# Patient Record
Sex: Female | Born: 1935 | Race: White | Hispanic: No | Smoking: Former smoker
Health system: Southern US, Community
[De-identification: ages and names within clinical notes are randomized; demographics above are authoritative.]

## PROBLEM LIST (undated history)

## (undated) ENCOUNTER — Emergency Department: Admission: EM | Payer: Self-pay | Source: Home / Self Care

## (undated) DIAGNOSIS — Z8719 Personal history of other diseases of the digestive system: Secondary | ICD-10-CM

## (undated) DIAGNOSIS — J45909 Unspecified asthma, uncomplicated: Secondary | ICD-10-CM

## (undated) DIAGNOSIS — G629 Polyneuropathy, unspecified: Secondary | ICD-10-CM

## (undated) DIAGNOSIS — T4145XA Adverse effect of unspecified anesthetic, initial encounter: Secondary | ICD-10-CM

## (undated) DIAGNOSIS — T8859XA Other complications of anesthesia, initial encounter: Secondary | ICD-10-CM

## (undated) DIAGNOSIS — F419 Anxiety disorder, unspecified: Secondary | ICD-10-CM

## (undated) DIAGNOSIS — K219 Gastro-esophageal reflux disease without esophagitis: Secondary | ICD-10-CM

## (undated) DIAGNOSIS — M1990 Unspecified osteoarthritis, unspecified site: Secondary | ICD-10-CM

## (undated) HISTORY — PX: CATARACT EXTRACTION, BILATERAL: SHX1313

## (undated) HISTORY — PX: JOINT REPLACEMENT: SHX530

## (undated) HISTORY — PX: COLONOSCOPY: SHX174

## (undated) HISTORY — PX: BACK SURGERY: SHX140

## (undated) HISTORY — PX: TUBAL LIGATION: SHX77

## (undated) HISTORY — PX: HEMORRHOID SURGERY: SHX153

## (undated) HISTORY — PX: VARICOSE VEIN SURGERY: SHX832

## (undated) HISTORY — PX: DILATION AND CURETTAGE OF UTERUS: SHX78

## (undated) HISTORY — PX: FOOT SURGERY: SHX648

## (undated) HISTORY — PX: BREAST SURGERY: SHX581

## (undated) HISTORY — PX: APPENDECTOMY: SHX54

## (undated) HISTORY — PX: ABDOMINAL HYSTERECTOMY: SHX81

---

## 2009-07-25 ENCOUNTER — Encounter: Admission: RE | Admit: 2009-07-25 | Discharge: 2009-07-25 | Payer: Self-pay | Admitting: Neurosurgery

## 2010-09-28 LAB — BASIC METABOLIC PANEL
BUN: 25 mg/dL — ABNORMAL HIGH (ref 6–23)
Calcium: 9.8 mg/dL (ref 8.4–10.5)
Creatinine, Ser: 1.03 mg/dL (ref 0.4–1.2)
GFR calc non Af Amer: 53 mL/min — ABNORMAL LOW (ref >60)
Glucose, Bld: 101 mg/dL — ABNORMAL HIGH (ref 70–99)
Potassium: 4.2 mEq/L (ref 3.5–5.1)

## 2010-09-28 LAB — CBC
HCT: 38.7 % (ref 36.0–46.0)
MCH: 28 pg (ref 26.0–34.0)
MCHC: 31.8 g/dL (ref 30.0–36.0)
MCV: 88.2 fL (ref 78.0–100.0)
Platelets: 266 10*3/uL (ref 150–400)
RDW: 13.6 % (ref 11.5–15.5)

## 2010-10-02 ENCOUNTER — Inpatient Hospital Stay (HOSPITAL_COMMUNITY)
Admission: RE | Admit: 2010-10-02 | Discharge: 2010-10-09 | DRG: 460 | Disposition: A | Discharge status: Skilled Nursing Facility | Payer: Medicare Other | Attending: Neurosurgery | Admitting: Neurosurgery

## 2010-10-02 DIAGNOSIS — Z880 Allergy status to penicillin: Secondary | ICD-10-CM

## 2010-10-02 DIAGNOSIS — M48061 Spinal stenosis, lumbar region without neurogenic claudication: Secondary | ICD-10-CM | POA: Diagnosis present

## 2010-10-02 DIAGNOSIS — M4310 Spondylolisthesis, site unspecified: Principal | ICD-10-CM | POA: Diagnosis present

## 2010-10-02 LAB — TYPE AND SCREEN: Antibody Screen: NEGATIVE

## 2010-10-08 ENCOUNTER — Encounter: Payer: Self-pay | Admitting: Internal Medicine

## 2010-10-25 NOTE — Op Note (Signed)
Jo Malone, Jo Malone              ACCOUNT NO.:  [id number]  MEDICAL RECORD NO.:  [id number]          PATIENT TYPE:  INP  LOCATION:  3020                         FACILITY:  [facility]  PHYSICIAN:  Reinaldo Meeker, M.D. DATE OF BIRTH:  1935/09/14  DATE OF PROCEDURE:  10/02/2010 DATE OF DISCHARGE:                              OPERATIVE REPORT   PREOPERATIVE DIAGNOSIS:  Spondylolisthesis with stenosis, L3-4, L4-5.  POSTOPERATIVE DIAGNOSIS:  Spondylolisthesis with stenosis, L3-4, L4-5.  PROCEDURES: 1. Left L3-4, L4-5 maximum access TLIF with PEEK interbody spacer. 2. Decompression of the L3, L4, and L5 nerve roots, more so than     needed for transverse lumbar interbody fusion followed by segmental     instrumentation L3-4, L4-5 with NuVasive DVR pedicle screw system.  SURGEON:  Reinaldo Meeker, MD  ASSISTANT:  Tia Alert, MD  PROCEDURE IN DETAIL:  After placing in the prone position, the patient's back was prepped and draped in usual sterile fashion.  Using AP fluoroscopy, entry point was identified for access to the L4 pedicle on the left.  Small stab incision was made.  Jamshidi needle was passed from lateral to medial direction and followed in excellent position under AP and lateral fluoroscopy.  Also used intraoperative EMG monitoring.  Placed guidewire through the Jamshidi needle and removed the needle.  We then did the same thing at L5.  Once again passing Jamshidi needle, placing the wire through it, and then removing the needle.  We then connected the two incisions, carried down through the fascia.  We then did sequential dilation at L4 and L5 and then tapped each level with 5-mm tap and placed 6.5 x 40 mm screws at both levels. These had the retractor blades attached, was then secured to the retractor system, attached the arm of the table.  We then opened the retractors placing the medial blade and secured the system to the table via the arm.  We then spent a 5-10  minutes removing the soft tissue to identify the facet joints and lamina of L4 and L5.  We did a generous laminotomy by removing the inferior half of the L4 lamina, the medial 80% of the facet joint, and the superior one-third of the L5 lamina. Residual bone was removed and saved to use later in the case. Ligamentum flavum removed in a piecemeal fashion.  We then took time undermining of the midline to do a sublaminar decompression on the opposite side due to the patient's diffuse stenosis.  We then identified the disk at L4-5 in the left, incised it, and thoroughly cleaned it out with pituitary rongeurs and curettes.  At this time, we prepared the disk space for interbody fusion.  We distracted up to an 8 mm size and felt that was a good fit.  We then used a rotating cutter and another instruments to clean the disk out of the disk space.  We then took a PEEK interbody spacer, filled it with a mixture of EquivaBone, Osteocel Plus, and autologous bone and packed it without difficulty.  Fluoroscopy showed it to be in excellent position.  Prior to placing the cage,  we placed the same mixture deep within the interspace to help with the interbody fusion.  At this time, we irrigated copiously and controlled any bleeding with bipolar coagulation and Gelfoam.  We then removed the hoop shim from the L5 screw and then attached the polyaxial head.  We then removed the retractor from both screws and rotated the middle screw to the opposite side so that was not the right-sided screw.  We then identified the lateral aspect of the L3 pedicle and a small stab incision, passed the Jamshidi needle through it in standard fashion.  I then placed the guidewire over the Jamshidi needle.  We then connected that incision to the lower incision and carried it through the fascia. We then did sequential dilation at L3 once again placing a 6.5 x 40-mm screw at that level.  We then attached the retractor once again  between the screws at L3 and L4, placed the medial blade.  We secured this to the table via the arm and then opened the blades in all directions.  We then once again took 10 minutes to remove soft tissue along the lamina and facet joint.  The facet was found to be markedly widened and pathologic.  We then once again did a generous laminotomy at this level once again removing the inferior three-quarters of the L3 lamina, the medial three-quarters of the facet joint, and the superior edge of the L4.  Once again, residual bone, ligamentum flavum removed in a piecemeal fashion.  Once again, we did generous sublaminar decompression in the opposite side because of the diffuse stenosis at this level.  At this time, the disk space of L3-4 was entered, cleaned out thoroughly with pituitary rongeurs and curettes.  We then distracted this level up to a 10-mm and felt this was a good fit, then used biting instruments to finish the preparation.  Prior to placing the PEEK interbody spacer, we placed a mixture of EquivaBone, OsteoSet Plus, and autologous bone deep within the interspace.  We then packed the PEEK spacer without difficulty and got a nice transverse kick until it was in excellent position.  We then irrigated once more at this level.  We then attached the polyaxial heads to the screws at L3 and L4.  We measured and chose a 60-mm rod.  We then secured to the top of the screws after advancing the screws in their final position, and then from the top loading knots, I did torque and counter torque, final tightening.  Final fluoroscopy in AP and lateral direction showed good placement of the interbody device as well as the screws and rods.  Irrigation was carried out once more. Any bleeding controlled with bipolar coagulation and Gelfoam.  I left a Hemovac drain, brought out through a separate stab incision.  We then closed the incision in multiple layers of Vicryl in the muscle  fascia, subcutaneous and subcuticular tissues and Dermabond was placed on the skin.  Sterile dressing was then applied.  The patient was extubated and taken to the recovery room in stable condition.          ______________________________ Reinaldo Meeker, M.D.     ROK/MEDQ  D:  10/02/2010  T:  10/03/2010  Job:  [id number]  Electronically Signed by Aliene Beams M.D. on 10/25/2010 10:49:11 PM

## 2010-10-25 NOTE — Discharge Summary (Signed)
  NAMECERYS, WINGET              ACCOUNT NO.:  [id number]  MEDICAL RECORD NO.:  [id number]          PATIENT TYPE:  INP  LOCATION:  3020                         FACILITY:  [facility]  PHYSICIAN:  Reinaldo Meeker, M.D. DATE OF BIRTH:  Feb 03, 1936  DATE OF ADMISSION:  10/02/2010 DATE OF DISCHARGE:  10/09/2010                              DISCHARGE SUMMARY   PRIMARY DIAGNOSIS:  Spondylolisthesis with stenosis L3-L4, L4-L5.  PRIMARY OPERATIVE PROCEDURE:  Left L3-L4, L4-L5 maximum access for PEEK interbody spacer.  HISTORY:  Ms. Haynie is a 75 year old female with back and left lower extremity pain.  She was evaluated with the imaging studies that showed a spondylolisthesis with stenosis L3-L4 and L4-L5 with left-sided nerve root compression.  After failing conservative therapy, she requested surgery.  She was admitted this time for left minimally invasive treatment at L3-L4, L4-L5.  On January 27th, the patient was taken to operating room with the above-mentioned procedure.  She tolerated the procedure well without any neurologic issues.  Her main issue postop here with a severe ileus, constipation which took a number of days to resolve.  We used Reglan as needed.  She began here with some bowel sounds, we gave her some laxatives and this did help finally for her bowel movement.  We then able to advance her diet without difficulty, which she tolerated well.  She is able to increase activities though she still did have some persistent left lower extremity pain, which is hopefully resolved with time.  On October 09, 2010, she was tolerating her diet, ambulating without difficulty and was felt that she be discharged to a skilled nursing facility for continued convalescence.  DISCHARGE MEDICATIONS: 1. Lasix 40 mg daily. 2. Advair inhaler 1 puff b.i.d. 3. Potassium chloride 10 mEq daily. 4. Reglan 10 mg three times a day. 5. Tessalon 100 mg capsule every 4 hours as needed for cough. 6.  Ventolin inhaler as needed. 7. Vicodin for pain. 8. Flexeril 10 mg three times a day as needed for pain. 9. Sterapred pack 6 day.  Her condition was markedly improved versus admission and her wound looked good.          ______________________________ Reinaldo Meeker, M.D.    ROK/MEDQ  D:  10/09/2010  T:  10/09/2010  Job:  [id number]  Electronically Signed by Aliene Beams M.D. on 10/25/2010 10:49:08 PM

## 2010-11-05 ENCOUNTER — Encounter: Payer: Self-pay | Admitting: Internal Medicine

## 2010-11-13 ENCOUNTER — Other Ambulatory Visit: Payer: Self-pay | Admitting: Neurosurgery

## 2010-11-13 ENCOUNTER — Ambulatory Visit
Admission: RE | Admit: 2010-11-13 | Discharge: 2010-11-13 | Disposition: A | Discharge status: Home / Self Care | Payer: Medicare Other | Source: Ambulatory Visit | Attending: Neurosurgery | Admitting: Neurosurgery

## 2010-11-13 DIAGNOSIS — M533 Sacrococcygeal disorders, not elsewhere classified: Secondary | ICD-10-CM

## 2010-11-13 DIAGNOSIS — IMO0002 Reserved for concepts with insufficient information to code with codable children: Secondary | ICD-10-CM

## 2010-12-08 ENCOUNTER — Other Ambulatory Visit: Payer: Self-pay | Admitting: Neurosurgery

## 2010-12-08 ENCOUNTER — Other Ambulatory Visit: Payer: Medicare Other

## 2010-12-08 DIAGNOSIS — M545 Low back pain, unspecified: Secondary | ICD-10-CM

## 2010-12-09 ENCOUNTER — Ambulatory Visit
Admission: RE | Admit: 2010-12-09 | Discharge: 2010-12-09 | Disposition: A | Discharge status: Home / Self Care | Payer: Medicare Other | Source: Ambulatory Visit | Attending: Neurosurgery | Admitting: Neurosurgery

## 2010-12-09 DIAGNOSIS — M545 Low back pain, unspecified: Secondary | ICD-10-CM

## 2010-12-09 MED ORDER — GADOBENATE DIMEGLUMINE 529 MG/ML IV SOLN
14.0000 mL | Freq: Once | INTRAVENOUS | Status: AC | PRN
  Administered 2010-12-09: 14 mL via INTRAVENOUS

## 2011-01-14 ENCOUNTER — Ambulatory Visit: Payer: Self-pay | Admitting: Podiatry

## 2011-02-07 ENCOUNTER — Other Ambulatory Visit: Payer: Self-pay | Admitting: Neurosurgery

## 2011-02-07 DIAGNOSIS — M545 Low back pain: Secondary | ICD-10-CM

## 2011-02-07 DIAGNOSIS — M79605 Pain in left leg: Secondary | ICD-10-CM

## 2011-02-19 ENCOUNTER — Ambulatory Visit
Admission: RE | Admit: 2011-02-19 | Discharge: 2011-02-19 | Disposition: A | Discharge status: Home / Self Care | Payer: Medicare Other | Source: Ambulatory Visit | Attending: Neurosurgery | Admitting: Neurosurgery

## 2011-02-19 DIAGNOSIS — M545 Low back pain: Secondary | ICD-10-CM

## 2011-02-19 DIAGNOSIS — M79605 Pain in left leg: Secondary | ICD-10-CM

## 2012-02-10 IMAGING — CR DG CHEST 2V
2 series · 2 of 2 positions shown · non-contrast
Comparison: None.

CLINICAL DATA: Preop stenosis/HNP.  Asthma, shortness breath,
nonsmoker.

CHEST - 2 VIEW

[view not recorded (1 of 2)]
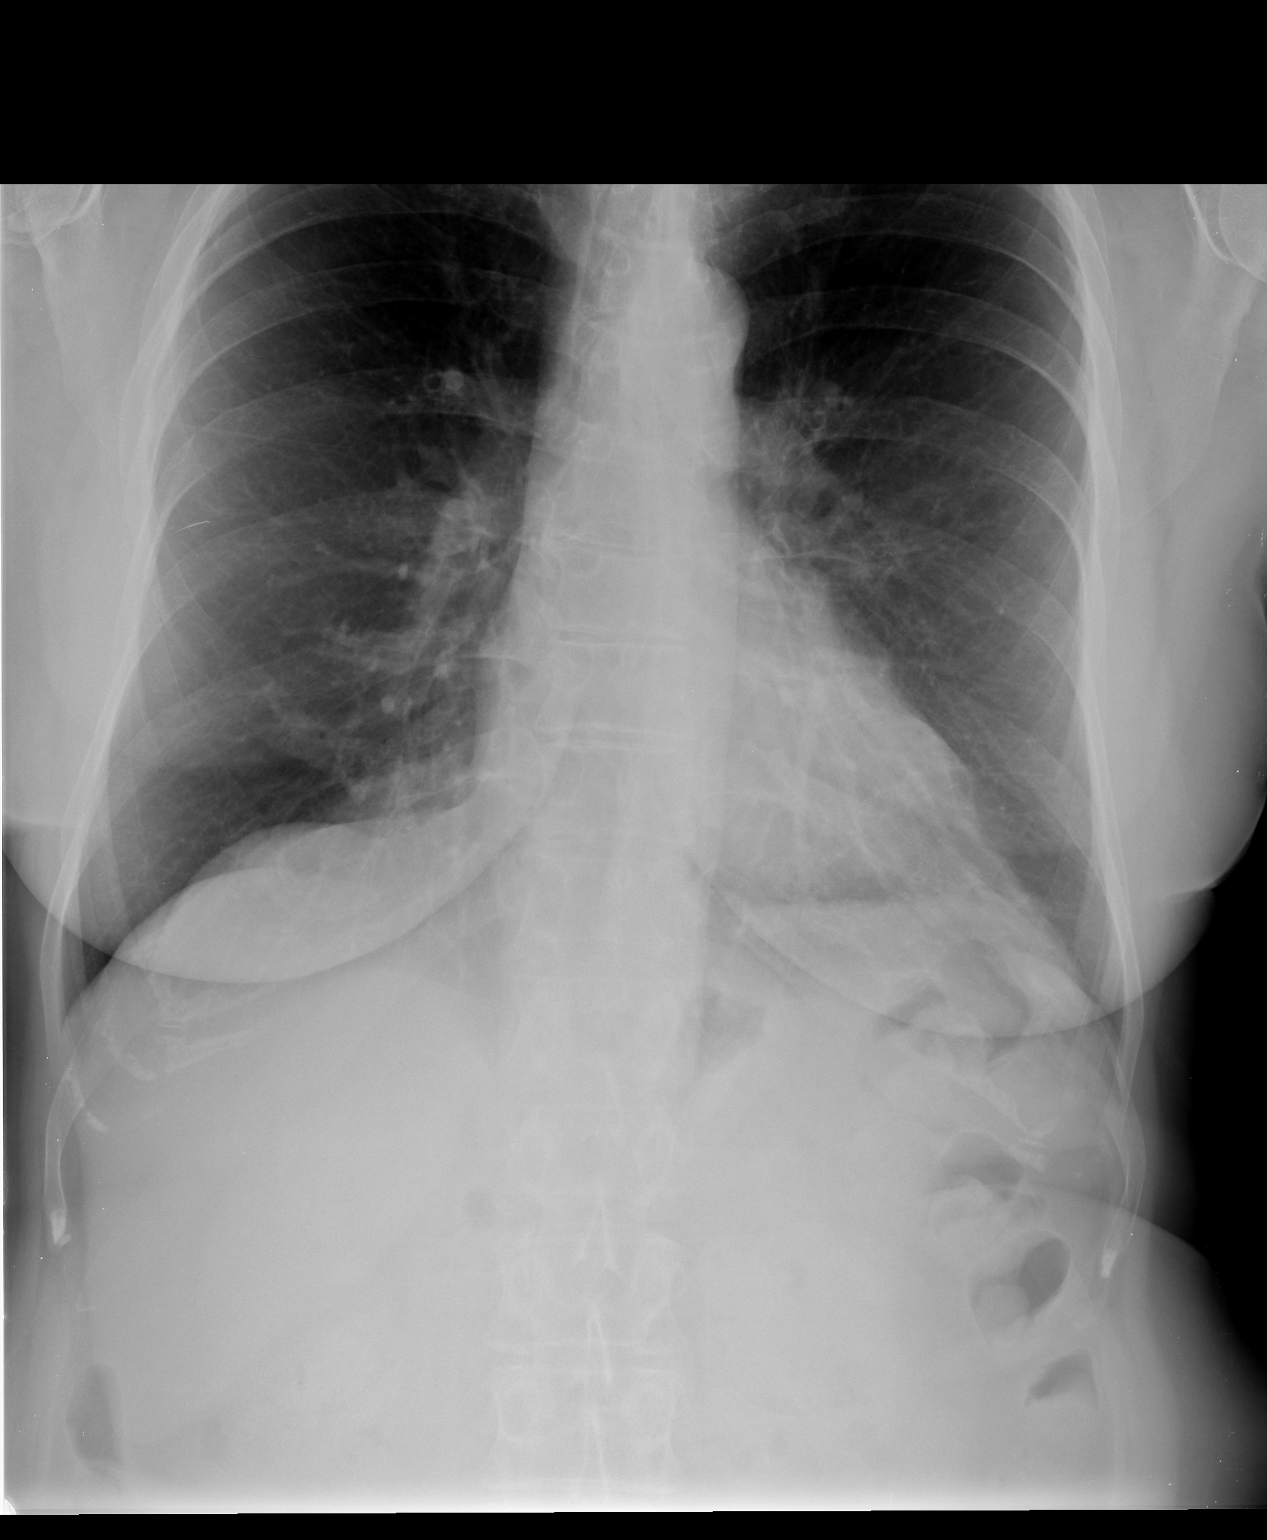

[view not recorded (2 of 2)]
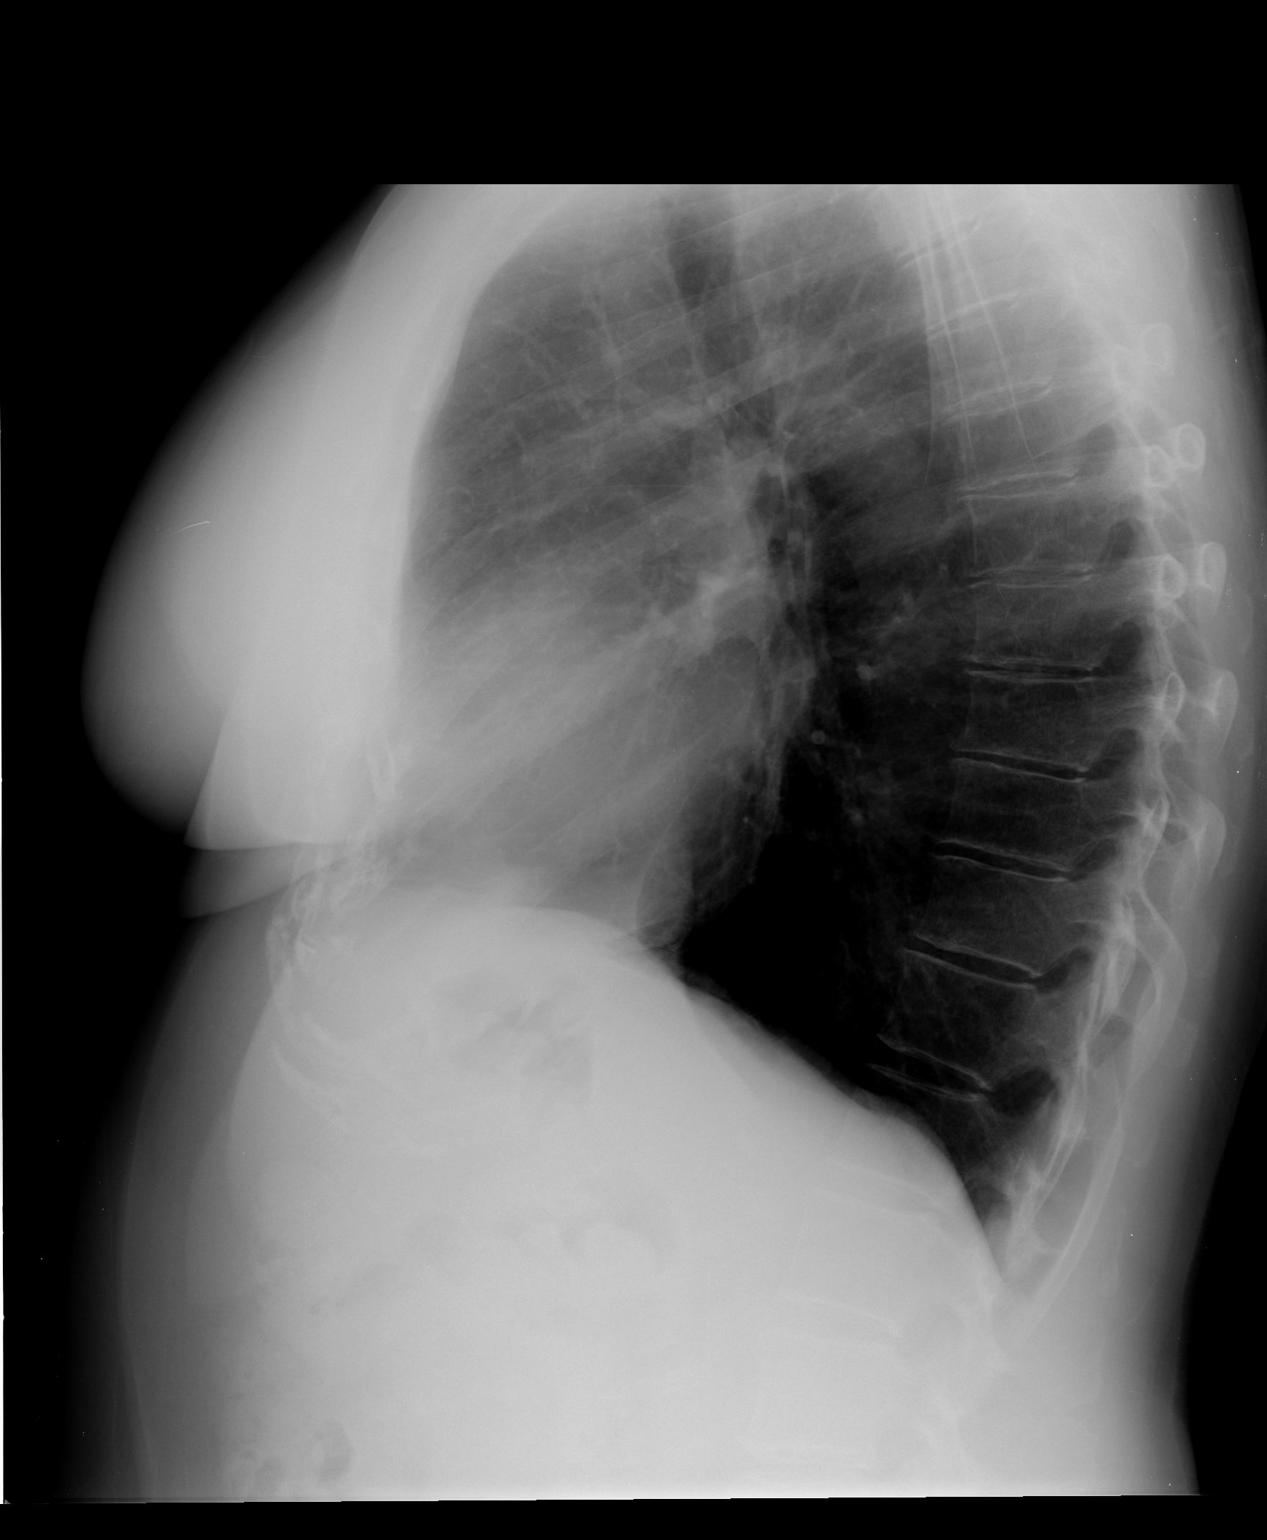

[2 of 2 positions shown; findings below may reference images not displayed]

FINDINGS: Slight perihilar peribronchial cuffing of known asthma
seen.  Lungs are otherwise clear without significant
hyperinflation.  Heart size and configuration normal.  Mediastinum,
hila, pleura and osseous structures unremarkable.
IMPRESSION: 1.  Slight changes of chronic asthma.
2.  No active cardiopulmonary disease.

## 2012-07-06 IMAGING — RF DG MYELOGRAM LUMBAR
13 of 16 series · 13 of 16 positions shown · IV contrast (omnipaque)
Comparison: MRI lumbar spine 12/09/2010 and lumbar myelogram
07/25/2009.

CLINICAL DATA: Status post previous fusion.  Recent fall with
severe pain, worse on the left.

MYELOGRAM INJECTION
TECHNIQUE: Informed consent was obtained from the patient prior to
the procedure, including potential complications of headache,
allergy, infection and pain.  A timeout procedure was performed.
With the patient prone, the lower back was prepped with Betadine.
1% Lidocaine was used for local anesthesia.  Lumbar puncture was
performed at the right paramidline L1-2 level using a 22 gauge
needle with return of clear CSF.  10 ml of Omnipaque 166was
injected into the subarachnoid space .
TECHNIQUE: Following injection of intrathecal Omnipaque contrast,
spine imaging in multiple projections was performed using
fluoroscopy.
Fluoroscopy Time: 1.19 minutes.
TECHNIQUE: CT imaging of the lumbar spine was performed after
intrathecal contrast administration.  Multiplanar CT image
reconstructions were also generated.

[Series 1: (hospital) · 1 of 1 slices shown]
[im 1/1]
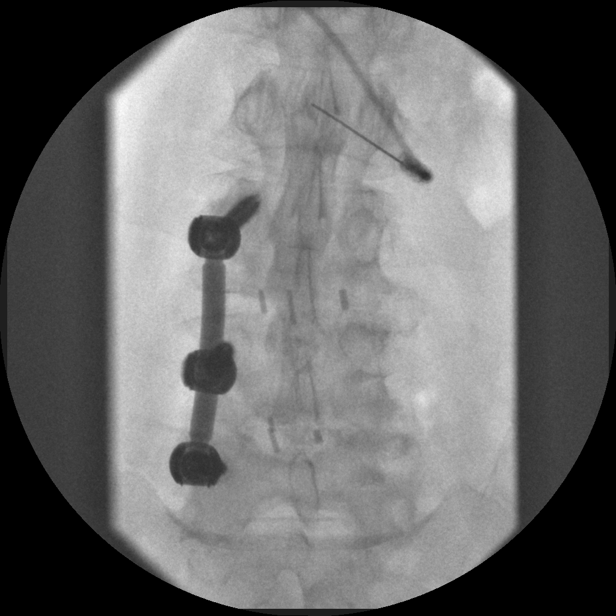

[Series 2: myelogram  white · 1 of 1 slices shown (1 of 10)]
[im 1/1]
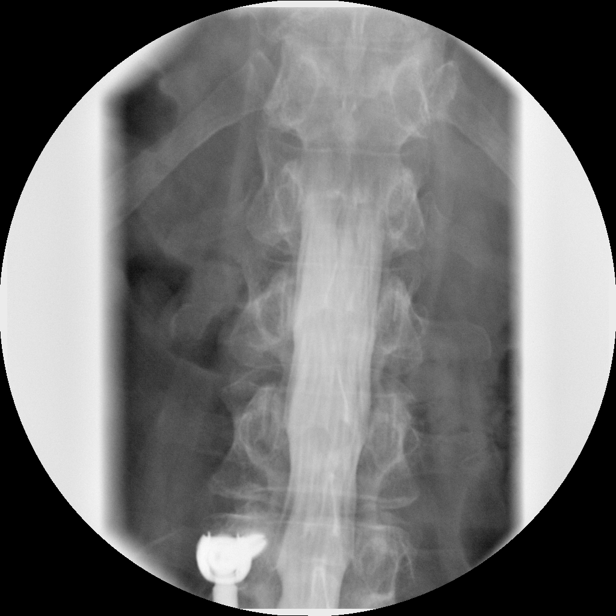

[Series 4: myelogram  white · 1 of 1 slices shown (2 of 10)]
[im 1/1]
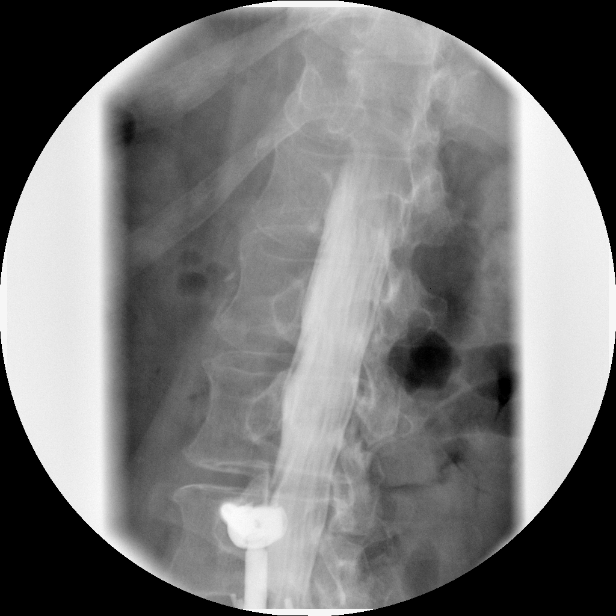

[Series 5: myelogram  white · 1 of 1 slices shown (3 of 10)]
[im 1/1]
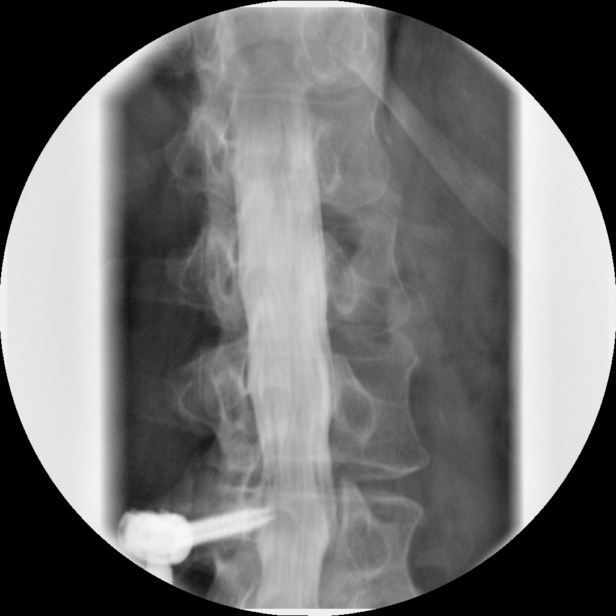

[Series 6: myelogram  white · 1 of 1 slices shown (4 of 10)]
[im 1/1]
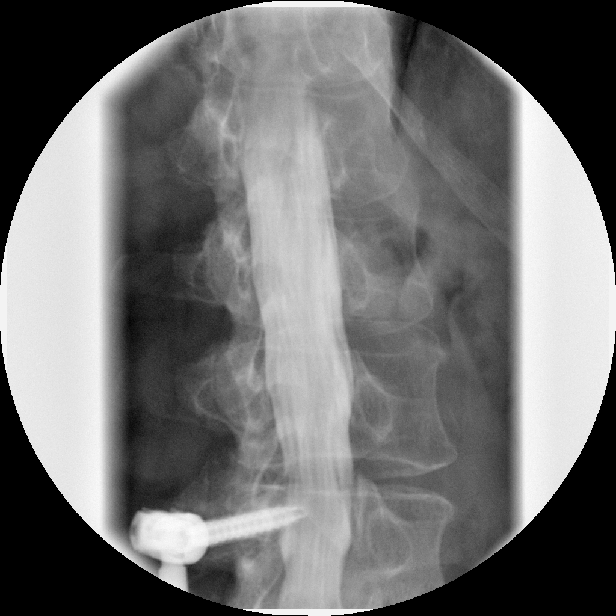

[Series 7: myelogram  white · 1 of 1 slices shown (5 of 10)]
[im 1/1]
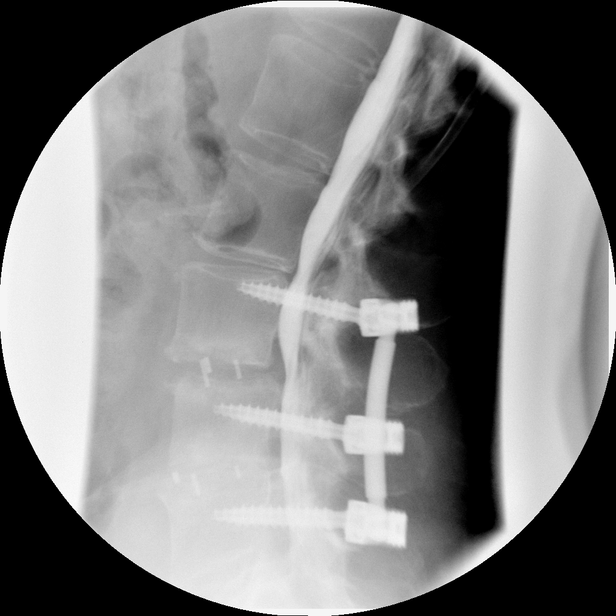

[Series 9: myelogram  white · 1 of 1 slices shown (6 of 10)]
[im 1/1]
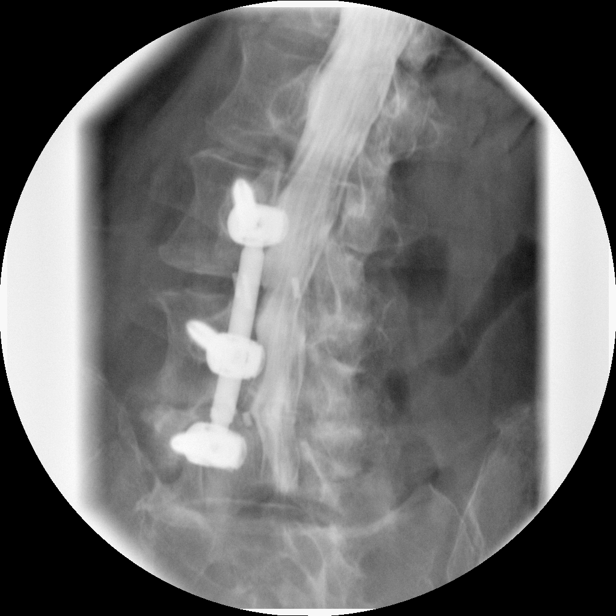

[Series 10: myelogram  white · 1 of 1 slices shown (7 of 10)]
[im 1/1]
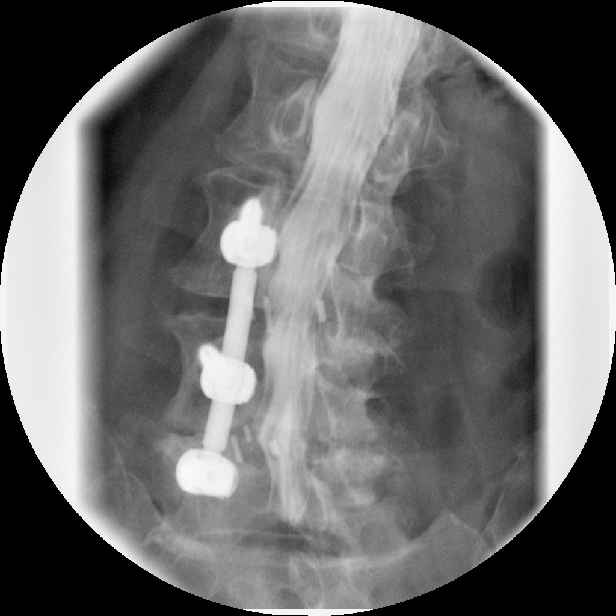

[Series 11: myelogram  white · 1 of 1 slices shown (8 of 10)]
[im 1/1]
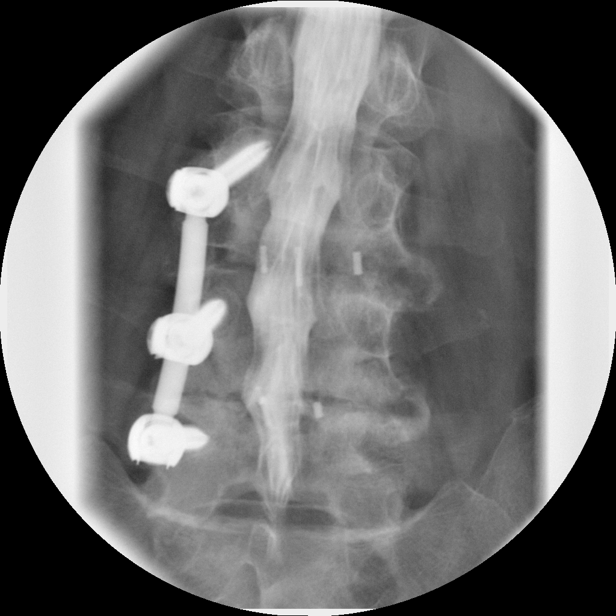

[Series 12: myelogram  white · 1 of 1 slices shown (9 of 10)]
[im 1/1]
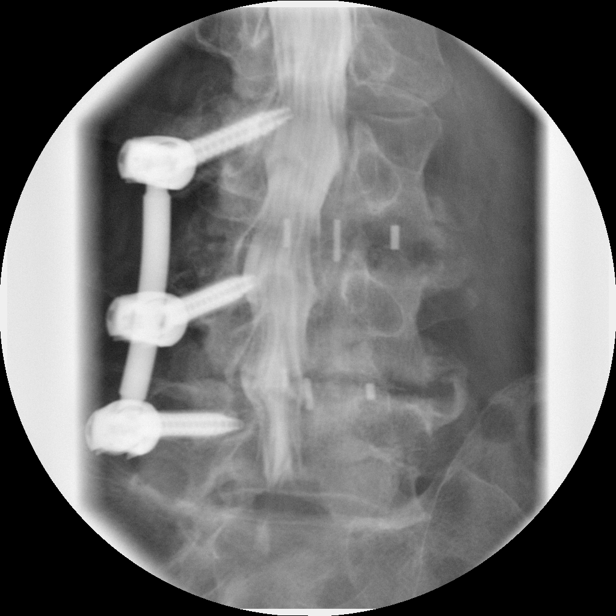

[Series 13: myelogram  white · 1 of 1 slices shown (10 of 10)]
[im 1/1]
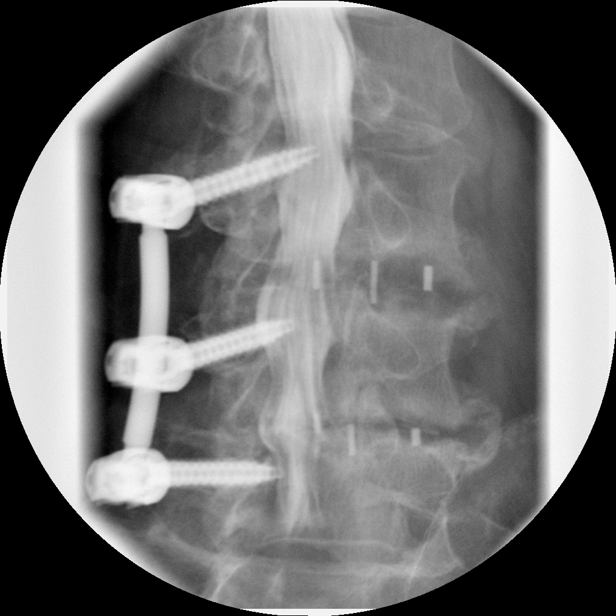

[Series 1002: view not recorded · 0.20mm/px · 1 of 1 slices shown (1 of 2)]
[im 1/1]
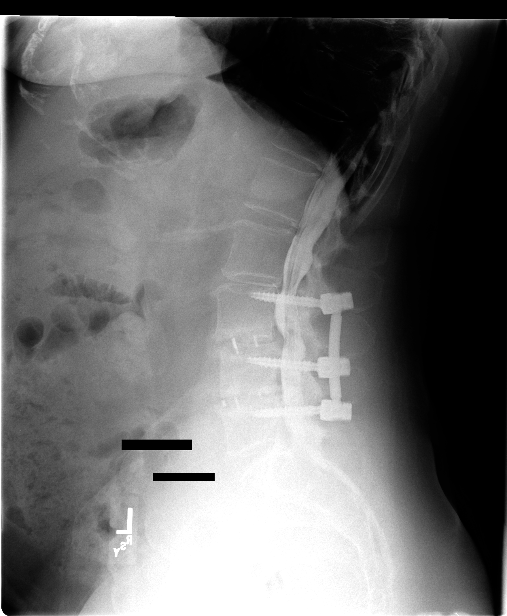

[Series 1003: view not recorded · 0.20mm/px · 1 of 1 slices shown (2 of 2)]
[im 1/1]
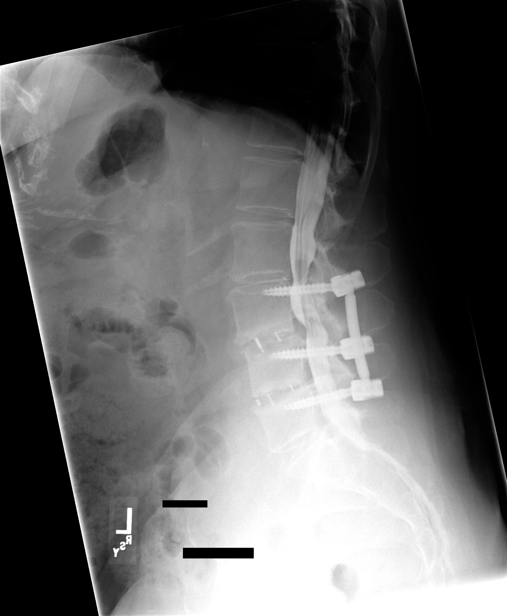

[13 of 16 positions shown; findings below may reference images not displayed]

IMPRESSION: Successful injection of  intrathecal contrast for myelography.

MYELOGRAM LUMBAR
FINDINGS: The patient is status post fusion at L3-4 and L4-5.  Disc
spacers are in place.  There is some lucency about the spacers at
L3-4.  Slight anterolisthesis at L3-4 is stable from the
preoperative studies.  There is residual soft tissue at L3-4.  This
is slightly more prominent upon standing, but does not change
significantly with flexion or extension.  There is no abnormal
movement across the fused segments during flexion or extension.
Minimal disc bulging is present at L2-3.  There is slight medial
deviation of the traversing right L3 nerve root.  No significant
disc bulge or stenosis is present at L4-5 or L5-S1.
IMPRESSION: 1.  Residual disc bulging at L3-4.  The soft tissue impact on the
central canal is greater with standing.
2.  Minimal disc bulge at L2-3.
3.  Interval fusion at L3-4 and L4-5.


CT MYELOGRAPHY LUMBAR SPINE
FINDINGS: The lumbar spine is imaged from midbody of T12-S2.  The
conus medullaris terminates at L1, within normal limits.  The
patient is status post attempted fusion at L3-4 and L4-5.
Unilateral left sided pedicle screw and rod fixation is in place.
There is lucency surrounding the disc spacer at L3-4 without
definite bridging bone.  There is also lucency about the L4-5 disc
space without definite bridging bone.  There is some lucency about
the L5 pedicle screw, suggesting loosening.

Limited imaging of the abdomen demonstrates no significant change
in mild atherosclerotic calcifications.  There is no aneurysm.  The
individual disc levels are as follows.

L1-2: No significant disc herniation is present.  Mild facet
hypertrophy is present.  There is no significant stenosis.

L2-3:  Slight retrolisthesis is similar to the MRI.  Disc bulging
is asymmetric to the left.  There is some distortion of the central
canal without significant focal stenosis.  Minimal impact on the
left lateral recess is noted.

L3-4: There is residual soft tissue extending from the disc space.
This was seen to be enhancing granulation tissue without residual
or recurrent disc material on a recent MRI.  However, there is some
mass effect on the left lateral recess. There seemed to be some
exaggeration of this mass effect upon standing.  The patient is
status post left hemilaminectomy.  A left foraminotomy was also
performed.  Right-sided facet hypertrophy persists with ligamentum
flavum thickening.

L4-5:  The patient is status post left laminectomy.  The soft
tissue within the left lateral recess was seen to be granulation
tissue on the previous MRI.  There is mild broad-based bulge.
Right-sided facet hypertrophy and ligament flavum thickening
persist.  There is prominent epidural fat as well.  Mild right
foraminal narrowing is present.

L5-S1:  Prominent epidural fat is evident.  No significant disc
herniation or focal stenosis is present.
IMPRESSION: 1.  Incomplete fusion at L3-4 and L4-5 with lucency surrounding the
disc spacers and no significant bridging bone across either disc
space.
2.  Prompt epidural granulation tissue on the left at L3-4 and L4-
5.
3.  Status post left laminectomies and foraminotomies at L3-4 and
L4-5.
4.  Residual mild right foraminal stenosis at to L4-5 and L5-S1
secondary to disc bulging and facet disease.
5.  Mild leftward disc bulge at L2-3 as on the prior studies
without significant stenosis.

## 2015-09-07 DIAGNOSIS — A0472 Enterocolitis due to Clostridium difficile, not specified as recurrent: Secondary | ICD-10-CM | POA: Insufficient documentation

## 2015-10-21 DIAGNOSIS — M1711 Unilateral primary osteoarthritis, right knee: Secondary | ICD-10-CM | POA: Insufficient documentation

## 2015-12-03 ENCOUNTER — Encounter
Admission: RE | Admit: 2015-12-03 | Discharge: 2015-12-03 | Disposition: A | Discharge status: Home / Self Care | Payer: Medicare Other | Source: Ambulatory Visit | Attending: Orthopedic Surgery | Admitting: Orthopedic Surgery

## 2015-12-03 DIAGNOSIS — Z01812 Encounter for preprocedural laboratory examination: Secondary | ICD-10-CM | POA: Diagnosis not present

## 2015-12-03 HISTORY — DX: Unspecified osteoarthritis, unspecified site: M19.90

## 2015-12-03 HISTORY — DX: Polyneuropathy, unspecified: G62.9

## 2015-12-03 HISTORY — DX: Unspecified asthma, uncomplicated: J45.909

## 2015-12-03 HISTORY — DX: Anxiety disorder, unspecified: F41.9

## 2015-12-03 HISTORY — DX: Gastro-esophageal reflux disease without esophagitis: K21.9

## 2015-12-03 LAB — PROTIME-INR
INR: 1
Prothrombin Time: 13.4 seconds (ref 11.4–15.0)

## 2015-12-03 LAB — URINALYSIS COMPLETE WITH MICROSCOPIC (ARMC ONLY)
BACTERIA UA: NONE SEEN
Bilirubin Urine: NEGATIVE
Glucose, UA: NEGATIVE mg/dL
HGB URINE DIPSTICK: NEGATIVE
Ketones, ur: NEGATIVE mg/dL
Leukocytes, UA: NEGATIVE
Nitrite: NEGATIVE
PH: 5 (ref 5.0–8.0)
PROTEIN: 30 mg/dL — AB
RBC / HPF: NONE SEEN RBC/hpf (ref 0–5)
Specific Gravity, Urine: 1.03 (ref 1.005–1.030)
WBC UA: NONE SEEN WBC/hpf (ref 0–5)

## 2015-12-03 LAB — BASIC METABOLIC PANEL
Anion gap: 8 (ref 5–15)
BUN: 20 mg/dL (ref 6–20)
CALCIUM: 9.5 mg/dL (ref 8.9–10.3)
CO2: 26 mmol/L (ref 22–32)
CREATININE: 0.8 mg/dL (ref 0.44–1.00)
Chloride: 106 mmol/L (ref 101–111)
GFR calc non Af Amer: >60 mL/min (ref >60)
Glucose, Bld: 81 mg/dL (ref 65–99)
Potassium: 3.6 mmol/L (ref 3.5–5.1)
Sodium: 140 mmol/L (ref 135–145)

## 2015-12-03 LAB — CBC
HCT: 39.7 % (ref 35.0–47.0)
Hemoglobin: 12.8 g/dL (ref 12.0–16.0)
MCH: 27.7 pg (ref 26.0–34.0)
MCHC: 32.3 g/dL (ref 32.0–36.0)
MCV: 85.9 fL (ref 80.0–100.0)
PLATELETS: 253 10*3/uL (ref 150–440)
RBC: 4.62 MIL/uL (ref 3.80–5.20)
RDW: 14.2 % (ref 11.5–14.5)
WBC: 12.9 10*3/uL — AB (ref 3.6–11.0)

## 2015-12-03 LAB — ABO/RH: ABO/RH(D): O POS

## 2015-12-03 LAB — APTT: APTT: 27 s (ref 24–36)

## 2015-12-03 LAB — TYPE AND SCREEN
ABO/RH(D): O POS
Antibody Screen: NEGATIVE

## 2015-12-03 LAB — SEDIMENTATION RATE: SED RATE: 41 mm/h — AB (ref 0–30)

## 2015-12-03 LAB — SURGICAL PCR SCREEN
MRSA, PCR: POSITIVE — AB
STAPHYLOCOCCUS AUREUS: POSITIVE — AB

## 2015-12-03 NOTE — Patient Instructions (Signed)
  Your procedure is scheduled on: Wednesday 12/17/15 Report to Day Surgery. 2ND FLOOR MEDICAL MALL ENTRANCE To find out your arrival time please call [phone] between 1PM - 3PM on Tuesday 12/16/15.  Remember: Instructions that are not followed completely may result in serious medical risk, up to and including death, or upon the discretion of your surgeon and anesthesiologist your surgery may need to be rescheduled.    __X__ 1. Do not eat food or drink liquids after midnight. No gum chewing or hard candies.     __X__ 2. No Alcohol for 24 hours before or after surgery.   ____ 3. Bring all medications with you on the day of surgery if instructed.    __X__ 4. Notify your doctor if there is any change in your medical condition     (cold, fever, infections).     Do not wear jewelry, make-up, hairpins, clips or nail polish.  Do not wear lotions, powders, or perfumes.   Do not shave 48 hours prior to surgery. Men may shave face and neck.  Do not bring valuables to the hospital.    [facility] is not responsible for any belongings or valuables.               Contacts, dentures or bridgework may not be worn into surgery.  Leave your suitcase in the car. After surgery it may be brought to your room.  For patients admitted to the hospital, discharge time is determined by your                treatment team.   Patients discharged the day of surgery will not be allowed to drive home.   Please read over the following fact sheets that you were given:   MRSA Information and Surgical Site Infection Prevention   __X__ Take these medicines the morning of surgery with A SIP OF WATER:    1. ALPRAZOLAM IF NEEDED  2. GABAPENTIN  3.   4.  5.  6.  ____ Fleet Enema (as directed)   _X___ Use CHG Soap as directed  __X__ Use inhalers on the day of surgery AND BRING WITH YOU  ____ Stop metformin 2 days prior to surgery    ____ Take 1/2 of usual insulin dose the night before surgery and none on the  morning of surgery.   ____ Stop Coumadin/Plavix/aspirin on   __X__ Stop Anti-inflammatories on STOP MELOXICAM ON 12/09/15 (LAST DOSE)   __X__ Stop supplements until after surgery.  STOP B12 ON 12/09/15 (LAST DOSE)   ____ Bring C-Pap to the hospital.

## 2015-12-03 NOTE — Pre-Procedure Instructions (Signed)
FAXED AND PHONED ABNORMAL LABS AND +MRSA +STAPH TO Margaretha Sheffield AT DR [facility] OFFICE

## 2015-12-06 LAB — URINE CULTURE

## 2015-12-08 NOTE — Pre-Procedure Instructions (Signed)
Urine culture results multiple species present, suggest recollection faxed to Cindy/El;aine at Dr. Clydell Hakim office.

## 2015-12-09 NOTE — Pre-Procedure Instructions (Signed)
Faxed received from Dr. Clydell Hakim office, Urine cultrue does not need to be repeated.

## 2015-12-10 NOTE — Pre-Procedure Instructions (Signed)
Clearance on paper  chart by Dr Clayton Bibles. [place].

## 2015-12-17 ENCOUNTER — Encounter
Admission: RE | Disposition: A | Discharge status: Home Health | Payer: Self-pay | Source: Ambulatory Visit | Attending: Orthopedic Surgery

## 2015-12-17 ENCOUNTER — Inpatient Hospital Stay
Admission: RE | Admit: 2015-12-17 | Discharge: 2015-12-19 | DRG: 470 | Disposition: A | Discharge status: Home Health | Payer: Medicare Other | Source: Ambulatory Visit | Attending: Orthopedic Surgery | Admitting: Orthopedic Surgery

## 2015-12-17 ENCOUNTER — Inpatient Hospital Stay: Payer: Medicare Other

## 2015-12-17 ENCOUNTER — Encounter: Payer: Self-pay | Admitting: Orthopedic Surgery

## 2015-12-17 ENCOUNTER — Inpatient Hospital Stay: Payer: Medicare Other | Admitting: Anesthesiology

## 2015-12-17 DIAGNOSIS — Z79899 Other long term (current) drug therapy: Secondary | ICD-10-CM

## 2015-12-17 DIAGNOSIS — K219 Gastro-esophageal reflux disease without esophagitis: Secondary | ICD-10-CM | POA: Diagnosis present

## 2015-12-17 DIAGNOSIS — J45909 Unspecified asthma, uncomplicated: Secondary | ICD-10-CM | POA: Diagnosis present

## 2015-12-17 DIAGNOSIS — Z96659 Presence of unspecified artificial knee joint: Secondary | ICD-10-CM

## 2015-12-17 DIAGNOSIS — Z7952 Long term (current) use of systemic steroids: Secondary | ICD-10-CM | POA: Diagnosis not present

## 2015-12-17 DIAGNOSIS — G629 Polyneuropathy, unspecified: Secondary | ICD-10-CM | POA: Diagnosis present

## 2015-12-17 DIAGNOSIS — M1712 Unilateral primary osteoarthritis, left knee: Secondary | ICD-10-CM | POA: Diagnosis present

## 2015-12-17 DIAGNOSIS — F419 Anxiety disorder, unspecified: Secondary | ICD-10-CM | POA: Diagnosis present

## 2015-12-17 HISTORY — PX: KNEE ARTHROPLASTY: SHX992

## 2015-12-17 LAB — POCT I-STAT 4, (NA,K, GLUC, HGB,HCT)
Glucose, Bld: 97 mg/dL (ref 65–99)
HCT: 39 % (ref 36.0–46.0)
HEMOGLOBIN: 13.3 g/dL (ref 12.0–15.0)
Potassium: 3.4 mmol/L — ABNORMAL LOW (ref 3.5–5.1)
Sodium: 140 mmol/L (ref 135–145)

## 2015-12-17 SURGERY — ARTHROPLASTY, KNEE, TOTAL, USING IMAGELESS COMPUTER-ASSISTED NAVIGATION
Anesthesia: Spinal | Laterality: Left

## 2015-12-17 MED ORDER — METOCLOPRAMIDE HCL 10 MG PO TABS
10.0000 mg | ORAL_TABLET | Freq: Three times a day (TID) | ORAL | Status: AC
  Administered 2015-12-17 – 2015-12-19 (×8): 10 mg via ORAL
  Filled 2015-12-17 (×8): qty 1

## 2015-12-17 MED ORDER — PROPOFOL 500 MG/50ML IV EMUL
INTRAVENOUS | Status: DC | PRN
  Administered 2015-12-17: 75 ug/kg/min via INTRAVENOUS

## 2015-12-17 MED ORDER — NEOMYCIN-POLYMYXIN B GU 40-200000 IR SOLN
Status: DC | PRN
  Administered 2015-12-17: 14 mL

## 2015-12-17 MED ORDER — POTASSIUM CHLORIDE CRYS ER 10 MEQ PO TBCR
10.0000 meq | EXTENDED_RELEASE_TABLET | Freq: Every day | ORAL | Status: DC
  Administered 2015-12-17 – 2015-12-19 (×3): 10 meq via ORAL
  Filled 2015-12-17 (×3): qty 1

## 2015-12-17 MED ORDER — NEOMYCIN-POLYMYXIN B GU 40-200000 IR SOLN
Status: AC
  Filled 2015-12-17: qty 20

## 2015-12-17 MED ORDER — ACETAMINOPHEN 10 MG/ML IV SOLN
INTRAVENOUS | Status: DC | PRN
  Administered 2015-12-17: 1000 mg via INTRAVENOUS

## 2015-12-17 MED ORDER — GABAPENTIN 300 MG PO CAPS
300.0000 mg | ORAL_CAPSULE | Freq: Two times a day (BID) | ORAL | Status: DC
  Administered 2015-12-17 – 2015-12-19 (×4): 300 mg via ORAL
  Filled 2015-12-17 (×4): qty 1

## 2015-12-17 MED ORDER — ACETAMINOPHEN 10 MG/ML IV SOLN
1000.0000 mg | Freq: Four times a day (QID) | INTRAVENOUS | Status: AC
  Administered 2015-12-17 – 2015-12-18 (×4): 1000 mg via INTRAVENOUS
  Filled 2015-12-17 (×4): qty 100

## 2015-12-17 MED ORDER — MENTHOL 3 MG MT LOZG: 1.0000 | LOZENGE | OROMUCOSAL | Status: DC | PRN

## 2015-12-17 MED ORDER — SODIUM CHLORIDE 0.9 % IV SOLN
INTRAVENOUS | Status: DC
  Administered 2015-12-17 – 2015-12-18 (×2): via INTRAVENOUS

## 2015-12-17 MED ORDER — ONDANSETRON HCL 4 MG PO TABS: 4.0000 mg | ORAL_TABLET | Freq: Four times a day (QID) | ORAL | Status: DC | PRN

## 2015-12-17 MED ORDER — MIDAZOLAM HCL 5 MG/5ML IJ SOLN
INTRAMUSCULAR | Status: DC | PRN
  Administered 2015-12-17: 1 mg via INTRAVENOUS

## 2015-12-17 MED ORDER — ACETAMINOPHEN 10 MG/ML IV SOLN
INTRAVENOUS | Status: AC
  Filled 2015-12-17: qty 100

## 2015-12-17 MED ORDER — FUROSEMIDE 40 MG PO TABS: 40.0000 mg | ORAL_TABLET | Freq: Every day | ORAL | Status: DC

## 2015-12-17 MED ORDER — FUROSEMIDE 40 MG PO TABS: 40.0000 mg | ORAL_TABLET | Freq: Every day | ORAL | Status: DC | PRN

## 2015-12-17 MED ORDER — TRAMADOL HCL 50 MG PO TABS
50.0000 mg | ORAL_TABLET | ORAL | Status: DC | PRN
  Administered 2015-12-17: 50 mg via ORAL
  Administered 2015-12-18 – 2015-12-19 (×6): 100 mg via ORAL
  Filled 2015-12-17: qty 2
  Filled 2015-12-17: qty 1
  Filled 2015-12-17 (×5): qty 2

## 2015-12-17 MED ORDER — MORPHINE SULFATE (PF) 2 MG/ML IV SOLN
2.0000 mg | INTRAVENOUS | Status: DC | PRN
  Administered 2015-12-17 – 2015-12-18 (×2): 2 mg via INTRAVENOUS
  Filled 2015-12-17 (×2): qty 1

## 2015-12-17 MED ORDER — BUPIVACAINE-EPINEPHRINE (PF) 0.25% -1:200000 IJ SOLN
INTRAMUSCULAR | Status: AC
  Filled 2015-12-17: qty 30

## 2015-12-17 MED ORDER — FAMOTIDINE 20 MG PO TABS
ORAL_TABLET | ORAL | Status: AC
  Administered 2015-12-17: 20 mg via ORAL
  Filled 2015-12-17: qty 1

## 2015-12-17 MED ORDER — SENNOSIDES-DOCUSATE SODIUM 8.6-50 MG PO TABS
1.0000 | ORAL_TABLET | Freq: Two times a day (BID) | ORAL | Status: DC
  Administered 2015-12-17 – 2015-12-18 (×2): 1 via ORAL
  Filled 2015-12-17 (×3): qty 1

## 2015-12-17 MED ORDER — FAMOTIDINE 20 MG PO TABS
20.0000 mg | ORAL_TABLET | Freq: Once | ORAL | Status: DC
  Administered 2015-12-17: 20 mg via ORAL

## 2015-12-17 MED ORDER — SODIUM CHLORIDE 0.9 % IV SOLN
INTRAVENOUS | Status: DC | PRN
  Administered 2015-12-17: 60 mL

## 2015-12-17 MED ORDER — FLEET ENEMA 7-19 GM/118ML RE ENEM: 1.0000 | ENEMA | Freq: Once | RECTAL | Status: DC | PRN

## 2015-12-17 MED ORDER — PHENYLEPHRINE HCL 10 MG/ML IJ SOLN
INTRAMUSCULAR | Status: DC | PRN
  Administered 2015-12-17: 100 ug via INTRAVENOUS
  Administered 2015-12-17 (×3): 50 ug via INTRAVENOUS
  Administered 2015-12-17 (×4): 100 ug via INTRAVENOUS
  Administered 2015-12-17: 50 ug via INTRAVENOUS

## 2015-12-17 MED ORDER — ONDANSETRON HCL 4 MG/2ML IJ SOLN: 4.0000 mg | Freq: Four times a day (QID) | INTRAMUSCULAR | Status: DC | PRN

## 2015-12-17 MED ORDER — ZOLPIDEM TARTRATE 5 MG PO TABS
5.0000 mg | ORAL_TABLET | Freq: Every evening | ORAL | Status: DC | PRN
  Administered 2015-12-17: 5 mg via ORAL
  Filled 2015-12-17: qty 1

## 2015-12-17 MED ORDER — MOMETASONE FURO-FORMOTEROL FUM 200-5 MCG/ACT IN AERO
2.0000 | INHALATION_SPRAY | Freq: Two times a day (BID) | RESPIRATORY_TRACT | Status: DC
  Administered 2015-12-17 – 2015-12-19 (×4): 2 via RESPIRATORY_TRACT
  Filled 2015-12-17: qty 8.8

## 2015-12-17 MED ORDER — SODIUM CHLORIDE 0.9 % IJ SOLN
INTRAMUSCULAR | Status: AC
Start: 2015-12-17 — End: 2015-12-17
  Filled 2015-12-17: qty 50

## 2015-12-17 MED ORDER — BUPIVACAINE LIPOSOME 1.3 % IJ SUSP
INTRAMUSCULAR | Status: AC
  Filled 2015-12-17: qty 20

## 2015-12-17 MED ORDER — BISACODYL 10 MG RE SUPP: 10.0000 mg | Freq: Every day | RECTAL | Status: DC | PRN

## 2015-12-17 MED ORDER — MAGNESIUM HYDROXIDE 400 MG/5ML PO SUSP: 30.0000 mL | Freq: Every day | ORAL | Status: DC | PRN

## 2015-12-17 MED ORDER — ALPRAZOLAM 0.25 MG PO TABS
0.2500 mg | ORAL_TABLET | Freq: Three times a day (TID) | ORAL | Status: DC | PRN
  Administered 2015-12-17: 0.25 mg via ORAL
  Filled 2015-12-17: qty 1

## 2015-12-17 MED ORDER — CELECOXIB 200 MG PO CAPS
200.0000 mg | ORAL_CAPSULE | Freq: Two times a day (BID) | ORAL | Status: DC
  Administered 2015-12-17 – 2015-12-19 (×4): 200 mg via ORAL
  Filled 2015-12-17 (×4): qty 1

## 2015-12-17 MED ORDER — TRANEXAMIC ACID 1000 MG/10ML IV SOLN
1000.0000 mg | INTRAVENOUS | Status: AC
  Administered 2015-12-17: 1000 mg via INTRAVENOUS
  Filled 2015-12-17: qty 10

## 2015-12-17 MED ORDER — VITAMIN D 1000 UNITS PO TABS
5000.0000 [IU] | ORAL_TABLET | Freq: Every day | ORAL | Status: DC
  Administered 2015-12-17 – 2015-12-19 (×3): 5000 [IU] via ORAL
  Filled 2015-12-17 (×3): qty 5

## 2015-12-17 MED ORDER — PANTOPRAZOLE SODIUM 40 MG PO TBEC
40.0000 mg | DELAYED_RELEASE_TABLET | Freq: Two times a day (BID) | ORAL | Status: DC
  Administered 2015-12-17 – 2015-12-19 (×4): 40 mg via ORAL
  Filled 2015-12-17 (×4): qty 1

## 2015-12-17 MED ORDER — ENOXAPARIN SODIUM 30 MG/0.3ML ~~LOC~~ SOLN
30.0000 mg | Freq: Two times a day (BID) | SUBCUTANEOUS | Status: DC
  Administered 2015-12-18 – 2015-12-19 (×3): 30 mg via SUBCUTANEOUS
  Filled 2015-12-17 (×3): qty 0.3

## 2015-12-17 MED ORDER — FERROUS SULFATE 325 (65 FE) MG PO TABS
325.0000 mg | ORAL_TABLET | Freq: Two times a day (BID) | ORAL | Status: DC
  Administered 2015-12-17 – 2015-12-19 (×4): 325 mg via ORAL
  Filled 2015-12-17 (×4): qty 1

## 2015-12-17 MED ORDER — VITAMIN B-12 1000 MCG PO TABS
1000.0000 ug | ORAL_TABLET | Freq: Every day | ORAL | Status: DC
  Administered 2015-12-18 – 2015-12-19 (×2): 1000 ug via ORAL
  Filled 2015-12-17 (×2): qty 1

## 2015-12-17 MED ORDER — ALBUTEROL SULFATE HFA 108 (90 BASE) MCG/ACT IN AERS: 2.0000 | INHALATION_SPRAY | Freq: Two times a day (BID) | RESPIRATORY_TRACT | Status: DC | PRN

## 2015-12-17 MED ORDER — BUPIVACAINE-EPINEPHRINE 0.25% -1:200000 IJ SOLN
INTRAMUSCULAR | Status: DC | PRN
  Administered 2015-12-17: 30 mL

## 2015-12-17 MED ORDER — VANCOMYCIN HCL IN DEXTROSE 1-5 GM/200ML-% IV SOLN
1000.0000 mg | Freq: Once | INTRAVENOUS | Status: AC
  Administered 2015-12-17: 1000 mg via INTRAVENOUS

## 2015-12-17 MED ORDER — CLINDAMYCIN PHOSPHATE 900 MG/50ML IV SOLN
900.0000 mg | Freq: Once | INTRAVENOUS | Status: AC
  Administered 2015-12-17: 900 mg via INTRAVENOUS

## 2015-12-17 MED ORDER — TRANEXAMIC ACID 1000 MG/10ML IV SOLN
1000.0000 mg | Freq: Once | INTRAVENOUS | Status: AC
  Administered 2015-12-17: 1000 mg via INTRAVENOUS
  Filled 2015-12-17: qty 10

## 2015-12-17 MED ORDER — MONTELUKAST SODIUM 10 MG PO TABS
10.0000 mg | ORAL_TABLET | Freq: Every day | ORAL | Status: DC
  Administered 2015-12-17 – 2015-12-18 (×2): 10 mg via ORAL
  Filled 2015-12-17 (×2): qty 1

## 2015-12-17 MED ORDER — PHENOL 1.4 % MT LIQD: 1.0000 | OROMUCOSAL | Status: DC | PRN

## 2015-12-17 MED ORDER — BUPIVACAINE HCL (PF) 0.5 % IJ SOLN
INTRAMUSCULAR | Status: AC
  Filled 2015-12-17: qty 30

## 2015-12-17 MED ORDER — ALUM & MAG HYDROXIDE-SIMETH 200-200-20 MG/5ML PO SUSP: 30.0000 mL | ORAL | Status: DC | PRN

## 2015-12-17 MED ORDER — OXYCODONE HCL 5 MG PO TABS
5.0000 mg | ORAL_TABLET | ORAL | Status: DC | PRN
  Administered 2015-12-17 (×2): 5 mg via ORAL
  Administered 2015-12-18: 10 mg via ORAL
  Administered 2015-12-18: 5 mg via ORAL
  Filled 2015-12-17 (×2): qty 1
  Filled 2015-12-17: qty 2
  Filled 2015-12-17: qty 1

## 2015-12-17 MED ORDER — ACETAMINOPHEN 325 MG PO TABS: 650.0000 mg | ORAL_TABLET | Freq: Four times a day (QID) | ORAL | Status: DC | PRN

## 2015-12-17 MED ORDER — ACETAMINOPHEN 650 MG RE SUPP
650.0000 mg | Freq: Four times a day (QID) | RECTAL | Status: DC | PRN
Start: 2015-12-17 — End: 2015-12-19

## 2015-12-17 MED ORDER — LACTATED RINGERS IV SOLN
INTRAVENOUS | Status: DC
  Administered 2015-12-17: 11:00:00 via INTRAVENOUS

## 2015-12-17 MED ORDER — ALBUTEROL SULFATE (2.5 MG/3ML) 0.083% IN NEBU: 2.5000 mg | INHALATION_SOLUTION | Freq: Four times a day (QID) | RESPIRATORY_TRACT | Status: DC | PRN

## 2015-12-17 MED ORDER — VANCOMYCIN HCL IN DEXTROSE 1-5 GM/200ML-% IV SOLN
INTRAVENOUS | Status: AC
Start: 2015-12-17 — End: 2015-12-17
  Administered 2015-12-17: 1000 mg via INTRAVENOUS
  Filled 2015-12-17: qty 200

## 2015-12-17 MED ORDER — FENTANYL CITRATE (PF) 100 MCG/2ML IJ SOLN
INTRAMUSCULAR | Status: DC | PRN
  Administered 2015-12-17: 50 ug via INTRAVENOUS

## 2015-12-17 MED ORDER — DIPHENHYDRAMINE HCL 12.5 MG/5ML PO ELIX: 12.5000 mg | ORAL_SOLUTION | ORAL | Status: DC | PRN

## 2015-12-17 MED ORDER — CLINDAMYCIN PHOSPHATE 900 MG/50ML IV SOLN
INTRAVENOUS | Status: AC
  Filled 2015-12-17: qty 50

## 2015-12-17 MED ORDER — LACTATED RINGERS IV SOLN
INTRAVENOUS | Status: DC | PRN
  Administered 2015-12-17 (×2): via INTRAVENOUS

## 2015-12-17 MED ORDER — BUPIVACAINE HCL (PF) 0.5 % IJ SOLN
INTRAMUSCULAR | Status: DC | PRN
  Administered 2015-12-17: 3 mL

## 2015-12-17 MED ORDER — CLINDAMYCIN PHOSPHATE 600 MG/50ML IV SOLN
600.0000 mg | Freq: Four times a day (QID) | INTRAVENOUS | Status: AC
  Administered 2015-12-17 – 2015-12-18 (×4): 600 mg via INTRAVENOUS
  Filled 2015-12-17 (×4): qty 50

## 2015-12-17 SURGICAL SUPPLY — 65 items
AUTOTRANSFUS HAS 1/8 (MISCELLANEOUS) ×3
BATTERY INSTRU NAVIGATION (MISCELLANEOUS) ×12 IMPLANT
BLADE SAW 1 (BLADE) ×3 IMPLANT
BLADE SAW 1/2 (BLADE) ×3 IMPLANT
BONE CEMENT GENTAMICIN (Cement) ×6 IMPLANT
CANISTER SUCT 1200ML W/VALVE (MISCELLANEOUS) ×3 IMPLANT
CANISTER SUCT 3000ML (MISCELLANEOUS) ×6 IMPLANT
CAPT KNEE TOTAL 3 ATTUNE ×3 IMPLANT
CATH TRAY METER 16FR LF (MISCELLANEOUS) ×3 IMPLANT
CEMENT BONE GENTAMICIN 40 (Cement) ×2 IMPLANT
COOLER POLAR GLACIER W/PUMP (MISCELLANEOUS) ×3 IMPLANT
DECANTER SPIKE VIAL GLASS SM (MISCELLANEOUS) ×3 IMPLANT
DRAPE SHEET LG 3/4 BI-LAMINATE (DRAPES) ×3 IMPLANT
DRSG DERMACEA 8X12 NADH (GAUZE/BANDAGES/DRESSINGS) ×3 IMPLANT
DRSG OPSITE POSTOP 4X14 (GAUZE/BANDAGES/DRESSINGS) ×3 IMPLANT
DRSG TEGADERM 4X4.75 (GAUZE/BANDAGES/DRESSINGS) ×3 IMPLANT
DURAPREP 26ML APPLICATOR (WOUND CARE) ×6 IMPLANT
ELECT CAUTERY BLADE 6.4 (BLADE) ×3 IMPLANT
ELECT REM PT RETURN 9FT ADLT (ELECTROSURGICAL) ×3
ELECTRODE REM PT RTRN 9FT ADLT (ELECTROSURGICAL) ×1 IMPLANT
EX-PIN ORTHOLOCK NAV 4X150 (PIN) ×6 IMPLANT
GLOVE BIO SURGEON STRL SZ7 (GLOVE) ×6 IMPLANT
GLOVE BIO SURGEON STRL SZ7.5 (GLOVE) ×3 IMPLANT
GLOVE BIOGEL M STRL SZ7.5 (GLOVE) ×6 IMPLANT
GLOVE BIOGEL PI IND STRL 8 (GLOVE) ×1 IMPLANT
GLOVE BIOGEL PI INDICATOR 8 (GLOVE) ×2
GLOVE INDICATOR 7.5 STRL GRN (GLOVE) ×3 IMPLANT
GLOVE INDICATOR 8.0 STRL GRN (GLOVE) ×3 IMPLANT
GLOVE SURG 9.0 ORTHO LTXF (GLOVE) ×3 IMPLANT
GLOVE SURG ORTHO 9.0 STRL STRW (GLOVE) ×3 IMPLANT
GOWN STRL REUS W/ TWL LRG LVL3 (GOWN DISPOSABLE) ×2 IMPLANT
GOWN STRL REUS W/ TWL XL LVL3 (GOWN DISPOSABLE) ×1 IMPLANT
GOWN STRL REUS W/TWL 2XL LVL3 (GOWN DISPOSABLE) ×3 IMPLANT
GOWN STRL REUS W/TWL LRG LVL3 (GOWN DISPOSABLE) ×4
GOWN STRL REUS W/TWL XL LVL3 (GOWN DISPOSABLE) ×2
HANDPIECE SUCTION TUBG SURGILV (MISCELLANEOUS) ×3 IMPLANT
HOLDER FOLEY CATH W/STRAP (MISCELLANEOUS) ×3 IMPLANT
HOOD PEEL AWAY FLYTE STAYCOOL (MISCELLANEOUS) ×6 IMPLANT
KIT RM TURNOVER STRD PROC AR (KITS) ×3 IMPLANT
KNIFE SCULPS 14X20 (INSTRUMENTS) ×3 IMPLANT
NDL SAFETY 18GX1.5 (NEEDLE) ×3 IMPLANT
NEEDLE SPNL 20GX3.5 QUINCKE YW (NEEDLE) ×3 IMPLANT
NS IRRIG 500ML POUR BTL (IV SOLUTION) ×3 IMPLANT
PACK TOTAL KNEE (MISCELLANEOUS) ×3 IMPLANT
PAD WRAPON POLAR KNEE (MISCELLANEOUS) ×1 IMPLANT
PIN DRILL QUICK PACK ×3 IMPLANT
PIN FIXATION 1/8DIA X 3INL (PIN) ×3 IMPLANT
SOL .9 NS 3000ML IRR  AL (IV SOLUTION) ×2
SOL .9 NS 3000ML IRR UROMATIC (IV SOLUTION) ×1 IMPLANT
SOL PREP PVP 2OZ (MISCELLANEOUS) ×3
SOLUTION PREP PVP 2OZ (MISCELLANEOUS) ×1 IMPLANT
SPONGE DRAIN TRACH 4X4 STRL 2S (GAUZE/BANDAGES/DRESSINGS) ×3 IMPLANT
STAPLER SKIN PROX 35W (STAPLE) ×3 IMPLANT
SUCTION FRAZIER HANDLE 10FR (MISCELLANEOUS) ×2
SUCTION TUBE FRAZIER 10FR DISP (MISCELLANEOUS) ×1 IMPLANT
SUT VIC AB 0 CT1 36 (SUTURE) ×3 IMPLANT
SUT VIC AB 1 CT1 36 (SUTURE) ×6 IMPLANT
SUT VIC AB 2-0 CT2 27 (SUTURE) ×3 IMPLANT
SYR 20CC LL (SYRINGE) ×3 IMPLANT
SYR 30ML LL (SYRINGE) ×3 IMPLANT
SYR 50ML LL SCALE MARK (SYRINGE) ×3 IMPLANT
SYSTEM AUTOTRANSFUS DUAL TROCR (MISCELLANEOUS) ×1 IMPLANT
TOWEL OR 17X26 4PK STRL BLUE (TOWEL DISPOSABLE) ×3 IMPLANT
TOWER CARTRIDGE SMART MIX (DISPOSABLE) ×3 IMPLANT
WRAPON POLAR PAD KNEE (MISCELLANEOUS) ×3

## 2015-12-17 NOTE — Brief Op Note (Signed)
12/17/2015  2:59 PM  PATIENT:  Jo Malone  80 y.o. female  PRE-OPERATIVE DIAGNOSIS:  degenerative arthrosis left knee  POST-OPERATIVE DIAGNOSIS:  degenerative arthrosis left knee  PROCEDURE:  Procedure(s): COMPUTER ASSISTED TOTAL KNEE ARTHROPLASTY (Left)  SURGEON:  Surgeon(s) and Role:    * Dereck Leep, MD - Primary  ASSISTANTS: Vance Peper, PA   ANESTHESIA:   spinal  EBL:  Total I/O In: 1200 [I.V.:1200] Out: 195 [Urine:175; Blood:20]  BLOOD ADMINISTERED:none  DRAINS: 2 medium drains to a reinfusion system   LOCAL MEDICATIONS USED:  MARCAINE    and OTHER Exparel  SPECIMEN:  No Specimen  DISPOSITION OF SPECIMEN:  N/A  COUNTS:  YES  TOURNIQUET:  87 minutes  DICTATION: .Dragon Dictation  PLAN OF CARE: Admit to inpatient   PATIENT DISPOSITION:  PACU - hemodynamically stable.   Delay start of Pharmacological VTE agent (>24hrs) due to surgical blood loss or risk of bleeding: yes

## 2015-12-17 NOTE — H&P (Signed)
The patient has been re-examined, and the chart reviewed, and there have been no interval changes to the documented history and physical.    The risks, benefits, and alternatives have been discussed at length. The patient expressed understanding of the risks benefits and agreed with plans for surgical intervention.  Maymuna Detzel P. Tashiana Lamarca, Jr. M.D.    

## 2015-12-17 NOTE — Anesthesia Procedure Notes (Signed)
Spinal  Staffing Anesthesiologist: Gunnar Fusi Resident/CRNA: Lance Muss Performed by: anesthesiologist and resident/CRNA  Preanesthetic Checklist Completed: patient identified, site marked, surgical consent, pre-op evaluation, timeout performed, IV checked, risks and benefits discussed and monitors and equipment checked Spinal Block Patient position: sitting Prep: Betadine Patient monitoring: heart rate, continuous pulse ox and blood pressure Approach: midline Location: L3-4 Injection technique: single-shot Needle Needle type: Whitacre  Needle gauge: 24 G Assessment Sensory level: T4 Additional Notes Spinal attempt performed by CRNA was unsuccessful. MDA was able to obtain spinal with positive CSF return. Patient tolerated procedure well.

## 2015-12-17 NOTE — Anesthesia Preprocedure Evaluation (Signed)
Anesthesia Evaluation  Patient identified by MRN, date of birth, ID band Patient awake    Reviewed: Allergy & Precautions, NPO status , Patient's Chart, lab work & pertinent test results  History of Anesthesia Complications Negative for: history of anesthetic complications  Airway Mallampati: II       Dental   Pulmonary asthma ,           Cardiovascular negative cardio ROS       Neuro/Psych Anxiety negative neurological ROS     GI/Hepatic Neg liver ROS, GERD  ,  Endo/Other  negative endocrine ROS  Renal/GU negative Renal ROS     Musculoskeletal  (+) Arthritis , Osteoarthritis,    Abdominal   Peds  Hematology negative hematology ROS (+)   Anesthesia Other Findings   Reproductive/Obstetrics                             Anesthesia Physical Anesthesia Plan  ASA: II  Anesthesia Plan: Spinal   Post-op Pain Management:    Induction:   Airway Management Planned: Nasal Cannula  Additional Equipment:   Intra-op Plan:   Post-operative Plan:   Informed Consent: I have reviewed the patients History and Physical, chart, labs and discussed the procedure including the risks, benefits and alternatives for the proposed anesthesia with the patient or authorized representative who has indicated his/her understanding and acceptance.     Plan Discussed with:   Anesthesia Plan Comments:         Anesthesia Quick Evaluation

## 2015-12-17 NOTE — Transfer of Care (Signed)
Immediate Anesthesia Transfer of Care Note  Patient: Jo Malone  Procedure(s) Performed: Procedure(s): COMPUTER ASSISTED TOTAL KNEE ARTHROPLASTY (Left)  Patient Location: PACU  Anesthesia Type:Spinal  Level of Consciousness: awake, alert  and oriented  Airway & Oxygen Therapy: Patient Spontanous Breathing and Patient connected to face mask oxygen  Post-op Assessment: Report given to RN and Post -op Vital signs reviewed and stable  Post vital signs: stable  Last Vitals:  Filed Vitals:   12/17/15 1002 12/17/15 1454  BP: 142/80 88/56  Pulse: 81 68  Temp: 36.7 C 36.2 C  Resp: 16 15    Complications: No apparent anesthesia complications

## 2015-12-17 NOTE — Op Note (Signed)
OPERATIVE NOTE  DATE OF SURGERY:  12/17/2015  PATIENT NAME:  BENJIE WORTHAN   DOB: 1936/04/06  MRN: [id number]  PRE-OPERATIVE DIAGNOSIS: Degenerative arthrosis of the left knee, primary  POST-OPERATIVE DIAGNOSIS:  Same  PROCEDURE:  Left total knee arthroplasty using computer-assisted navigation  SURGEON:  Marciano Sequin. M.D.  ASSISTANT:  Vance Peper, PA (present and scrubbed throughout the case, critical for assistance with exposure, retraction, instrumentation, and closure)  ANESTHESIA: spinal  ESTIMATED BLOOD LOSS: 20 mL  FLUIDS REPLACED: 1200 mL of crystalloid  TOURNIQUET TIME: 87 minutes  DRAINS: 2 medium drains to a reinfusion system  SOFT TISSUE RELEASES: Anterior cruciate ligament, posterior cruciate ligament, deep medial collateral ligament, patellofemoral ligament   IMPLANTS UTILIZED: DePuy Attune size 5 posterior stabilized femoral component (cemented), size 5 rotating platform tibial component (cemented), 35 mm medialized dome patella (cemented), and a 6 mm stabilized rotating platform polyethylene insert.  INDICATIONS FOR SURGERY: WHISPER PANASUK is a 80 y.o. year old female with a long history of progressive knee pain. X-rays demonstrated severe degenerative changes in tricompartmental fashion. The patient had not seen any significant improvement despite conservative nonsurgical intervention. After discussion of the risks and benefits of surgical intervention, the patient expressed understanding of the risks benefits and agree with plans for total knee arthroplasty.   The risks, benefits, and alternatives were discussed at length including but not limited to the risks of infection, bleeding, nerve injury, stiffness, blood clots, the need for revision surgery, cardiopulmonary complications, among others, and they were willing to proceed.  PROCEDURE IN DETAIL: The patient was brought into the operating room and, after adequate spinal anesthesia was achieved, a  tourniquet was placed on the patient's upper thigh. The patient's knee and leg were cleaned and prepped with alcohol and DuraPrep and draped in the usual sterile fashion. A "timeout" was performed as per usual protocol. The lower extremity was exsanguinated using an Esmarch, and the tourniquet was inflated to 300 mmHg. An anterior longitudinal incision was made followed by a standard mid vastus approach. The deep fibers of the medial collateral ligament were elevated in a subperiosteal fashion off of the medial flare of the tibia so as to maintain a continuous soft tissue sleeve. The patella was subluxed laterally and the patellofemoral ligament was incised. Inspection of the knee demonstrated severe degenerative changes with full-thickness loss of articular cartilage. Osteophytes were debrided using a rongeur. Anterior and posterior cruciate ligaments were excised. Two 4.0 mm Schanz pins were inserted in the femur and into the tibia for attachment of the array of trackers used for computer-assisted navigation. Hip center was identified using a circumduction technique. Distal landmarks were mapped using the computer. The distal femur and proximal tibia were mapped using the computer. The distal femoral cutting guide was positioned using computer-assisted navigation so as to achieve a 5 distal valgus cut. The femur was sized and it was felt that a size 5 femoral component was appropriate. A size 5 femoral cutting guide was positioned and the anterior cut was performed and verified using the computer. This was followed by completion of the posterior and chamfer cuts. Femoral cutting guide for the central box was then positioned in the center box cut was performed.  Attention was then directed to the proximal tibia. Medial and lateral menisci were excised. The extramedullary tibial cutting guide was positioned using computer-assisted navigation so as to achieve a 0 varus-valgus alignment and 3 posterior slope. The  cut was performed and verified using the  computer. The proximal tibia was sized and it was felt that a size 5 tibial tray was appropriate. Tibial and femoral trials were inserted followed by insertion of a first a 5 and subsequently a 6 mm polyethylene insert. This allowed for excellent mediolateral soft tissue balancing both in flexion and in full extension. Finally, the patella was cut and prepared so as to accommodate a 35 mm medialized dome patella. A patella trial was placed and the knee was placed through a range of motion with excellent patellar tracking appreciated. The femoral trial was removed after debridement of posterior osteophytes. The central post-hole for the tibial component was reamed followed by insertion of a keel punch. Tibial trials were then removed. Cut surfaces of bone were irrigated with copious amounts of normal saline with antibiotic solution using pulsatile lavage and then suctioned dry. Polymethylmethacrylate cement with gentamicin was prepared in the usual fashion using a vacuum mixer. Cement was applied to the cut surface of the proximal tibia as well as along the undersurface of a size 5 rotating platform tibial component. Tibial component was positioned and impacted into place. Excess cement was removed using [employer] fast streamer. Cement was then applied to the cut surfaces of the femur as well as along the posterior flanges of the size 5 femoral component. The femoral component was positioned and impacted into place. Excess cement was removed using [employer] fast streamer. A 6 mm polyethylene trial was inserted and the knee was brought into full extension with steady axial compression applied. Finally, cement was applied to the backside of a 35 mm medialized dome patella and the patellar component was positioned and patellar clamp applied. Excess cement was removed using [employer] fast streamer. After adequate curing of the cement, the tourniquet was deflated after a total tourniquet time of 87  minutes. Hemostasis was achieved using electrocautery. The knee was irrigated with copious amounts of normal saline with antibiotic solution using pulsatile lavage and then suctioned dry. 20 mL of 1.3% Exparel in 40 mL of normal saline was injected along the posterior capsule, medial and lateral gutters, and along the arthrotomy site. A 6 mm stabilized rotating platform polyethylene insert was inserted and the knee was placed through a range of motion with excellent mediolateral soft tissue balancing appreciated and excellent patellar tracking noted. 2 medium drains were placed in the wound bed and brought out through separate stab incisions to be attached to a reinfusion system. The medial parapatellar portion of the incision was reapproximated using interrupted sutures of #1 Vicryl. Subcutaneous tissue was then injected with a total of 30 cc of 0.25% Marcaine with epinephrine. Subcutaneous tissue was approximated in layers using first #0 Vicryl followed #2-0 Vicryl. The skin was approximated with skin staples. A sterile dressing was applied.  The patient tolerated the procedure well and was transported to the recovery room in stable condition.    James P. Holley Bouche., M.D.

## 2015-12-17 NOTE — Progress Notes (Signed)
Pt seen using the incentive spirometer while awake. Reinforced education about the use of oxygen inhalation but pt refused to put them on at this time. O2 saturation 99% at room air. Pt dangled out of bed with assistance as ordered. Tolerated well.

## 2015-12-18 ENCOUNTER — Encounter: Payer: Self-pay | Admitting: Orthopedic Surgery

## 2015-12-18 LAB — CBC
HCT: 31.5 % — ABNORMAL LOW (ref 35.0–47.0)
HEMOGLOBIN: 10.4 g/dL — AB (ref 12.0–16.0)
MCH: 28.2 pg (ref 26.0–34.0)
MCHC: 33 g/dL (ref 32.0–36.0)
MCV: 85.3 fL (ref 80.0–100.0)
PLATELETS: 190 10*3/uL (ref 150–440)
RBC: 3.7 MIL/uL — AB (ref 3.80–5.20)
RDW: 13.6 % (ref 11.5–14.5)
WBC: 7.2 10*3/uL (ref 3.6–11.0)

## 2015-12-18 LAB — BASIC METABOLIC PANEL
ANION GAP: 4 — AB (ref 5–15)
BUN: 16 mg/dL (ref 6–20)
CHLORIDE: 107 mmol/L (ref 101–111)
CO2: 25 mmol/L (ref 22–32)
Calcium: 8.3 mg/dL — ABNORMAL LOW (ref 8.9–10.3)
Creatinine, Ser: 1.03 mg/dL — ABNORMAL HIGH (ref 0.44–1.00)
GFR, EST AFRICAN AMERICAN: 59 mL/min — AB (ref >60)
GFR, EST NON AFRICAN AMERICAN: 51 mL/min — AB (ref >60)
Glucose, Bld: 128 mg/dL — ABNORMAL HIGH (ref 65–99)
Potassium: 3.6 mmol/L (ref 3.5–5.1)
SODIUM: 136 mmol/L (ref 135–145)

## 2015-12-18 MED ORDER — HYDROCORTISONE 1 % EX CREA: TOPICAL_CREAM | Freq: Three times a day (TID) | CUTANEOUS | Status: DC | PRN

## 2015-12-18 MED ORDER — PHENYLEPHRINE-MINERAL OIL-PET 0.25-14-74.9 % RE OINT
1.0000 "application " | TOPICAL_OINTMENT | Freq: Three times a day (TID) | RECTAL | Status: DC | PRN
  Administered 2015-12-18 – 2015-12-19 (×2): 1 via RECTAL
  Filled 2015-12-18 (×3): qty 57

## 2015-12-18 MED ORDER — OXYCODONE HCL 5 MG PO TABS: 5.0000 mg | ORAL_TABLET | ORAL | Status: DC | PRN

## 2015-12-18 MED ORDER — LACTULOSE 10 GM/15ML PO SOLN
10.0000 g | Freq: Two times a day (BID) | ORAL | Status: DC | PRN
  Administered 2015-12-18: 10 g via ORAL
  Filled 2015-12-18: qty 30

## 2015-12-18 MED ORDER — TRAMADOL HCL 50 MG PO TABS: 50.0000 mg | ORAL_TABLET | ORAL | Status: DC | PRN

## 2015-12-18 MED ORDER — ENOXAPARIN SODIUM 40 MG/0.4ML ~~LOC~~ SOLN: 40.0000 mg | SUBCUTANEOUS | Status: DC

## 2015-12-18 NOTE — Care Management Note (Addendum)
Case Management Note  Patient Details  Name: Jo Malone MRN: [id number] Date of Birth: Aug 24, 1936  Subjective/Objective:     80yo [name] received a left TKA by Dr Marry Guan on 12/17/15. Living arrangements after surgery somewhat confused. States that her PCP is Dr Jordan Hawks Moushour [facility] "where I live." Plans to stay with her daughter Terese Door in [place] Cell: [phone]  immediately after surgery.  Has a rolling walker and a toilet seat elevator. Does not want a BSC. No home oxygen and no home health. [facility] to be her home health provider. Daughter will provide transportation to appointments.  Jason at [facility] updated about the HH-PT referral. Case management will follow for discharge planning.     Lovenox co-pay will be $66.90 and [name] was advised of the co-pay amount.     Action/Plan:      Expected Discharge Date:                  Expected Discharge Plan:     In-House Referral:     Discharge planning Services     Post Acute Care Choice:    Choice offered to:     DME Arranged:    DME Agency:     HH Arranged:    [place] Agency:     Status of Service:     Medicare Important Message Given:    Date Medicare IM Given:    Medicare IM give by:    Date Additional Medicare IM Given:    Additional Medicare Important Message give by:     If discussed at [place] of Stay Meetings, dates discussed:    Additional Comments:  Crosby Bevan A, RN 12/18/2015, 10:34 AM

## 2015-12-18 NOTE — Progress Notes (Signed)
   Subjective: 1 Day Post-Op Procedure(s) (LRB): COMPUTER ASSISTED TOTAL KNEE ARTHROPLASTY (Left) Patient reports pain as 4 on 0-10 scale.   Patient is well, and has had no acute complaints or problems We will start therapy today.  Plan is to go Home after hospital stay. no nausea and no vomiting Patient denies any chest pains or shortness of breath. Objective: Vital signs in last 24 hours: Temp:  [96.6 F (35.9 C)-98.3 F (36.8 C)] 98.3 F (36.8 C) (04/13 0510) Pulse Rate:  [60-81] 77 (04/13 0510) Resp:  [9-20] 18 (04/13 0510) BP: (88-142)/(44-80) 140/58 mmHg (04/13 0510) SpO2:  [97 %-100 %] 97 % (04/13 0510) FiO2 (%):  [28 %] 28 % (04/12 1625) Weight:  [81.647 kg (180 lb)] 81.647 kg (180 lb) (04/12 1002) Heels are non tender and elevated off the bed using rolled towels Intake/Output from previous day: 04/12 0701 - 04/13 0700 In: 3050 [P.O.:480; I.V.:2010; IV Piggyback:560] Out: 1545 [Urine:1335; Drains:190; Blood:20] Intake/Output this shift:     Recent Labs  12/17/15 1032 12/18/15 0618  HGB 13.3 10.4*    Recent Labs  12/17/15 1032 12/18/15 0618  WBC  --  7.2  RBC  --  3.70*  HCT 39.0 31.5*  PLT  --  190    Recent Labs  12/17/15 1032 12/18/15 0618  NA 140 136  K 3.4* 3.6  CL  --  107  CO2  --  25  BUN  --  16  CREATININE  --  1.03*  GLUCOSE 97 128*  CALCIUM  --  8.3*   No results for input(s): LABPT, INR in the last 72 hours.  EXAM General - Patient is Alert, Appropriate and Oriented Extremity - Neurologically intact Neurovascular intact Sensation intact distally Intact pulses distally Dorsiflexion/Plantar flexion intact Dressing - dressing C/D/I Motor Function - intact, moving foot and toes well on exam. Patient not able to do a straight leg raise on her own at this time.  Past Medical History  Diagnosis Date  . Anxiety   . Neuropathy ([place])   . Asthma   . GERD (gastroesophageal reflux disease)   . Arthritis     Assessment/Plan: 1  Day Post-Op Procedure(s) (LRB): COMPUTER ASSISTED TOTAL KNEE ARTHROPLASTY (Left) Active Problems:   S/P total knee arthroplasty  Estimated body mass index is 31.89 kg/(m^2) as calculated from the following:   Height as of this encounter: 5\' 3"  (1.6 m).   Weight as of this encounter: 81.647 kg (180 lb). [facility] diet Up with therapy D/C IV fluids Plan for discharge tomorrow Discharge home with home health  Labs: Were reviewed and within normal limits DVT Prophylaxis - Lovenox, Foot Pumps and TED hose Weight-Bearing as tolerated to left leg D/C O2 and Pulse OX and try on Room Air Begin working on having a bowel movement today. Labs in a.m.  Jillyn Ledger. [place] 12/18/2015, 7:25 AM

## 2015-12-18 NOTE — Discharge Instructions (Signed)

## 2015-12-18 NOTE — Progress Notes (Signed)
Physical Therapy Evaluation Patient Details Name: Jo Malone MRN: [id number] DOB: Jun 21, 1936 Today's Date: 12/18/2015   History of Present Illness  Pt is a 80 yo female POD#1 L TKA.  Clinical Impression  Pt presents with decreased strength and ROM, decreased functional mobility, and decreased ambulation s/p L TKA.  Pt requires Min A with HOB flat for supine to sit transfer and tolerated ambulation for 6'x2 CGA with RW and fair safety awareness.  Expect pt to progress well towards goals for discharge home with HHPT.  Pt would benefit from acute PT services to address objective findings.    Follow Up Recommendations Home health PT    Equipment Recommendations  Rolling walker with 5" wheels;3in1 (PT)    Recommendations for Other Services       Precautions / Restrictions Precautions Precautions: Knee Restrictions Weight Bearing Restrictions: Yes LLE Weight Bearing: Weight bearing as tolerated      Mobility  Bed Mobility Overal bed mobility: Needs Assistance Bed Mobility: Supine to Sit     Supine to sit: Min assist     General bed mobility comments: HOB flat, not using rails, verbal cues for sequencing and Min A for L LE management  Transfers Overall transfer level: Needs assistance Equipment used: Rolling walker (2 wheeled) Transfers: Sit to/from Stand Sit to Stand: Min assist         General transfer comment: Sit<>stand from bed with Min A for balance during hand transfer from bed to RW; good body mechanics and good lift off from bed.  Ambulation/Gait Ambulation/Gait assistance: Min guard Ambulation Distance (Feet): 12 Feet Assistive device: Rolling walker (2 wheeled) Gait Pattern/deviations: Step-to pattern     General Gait Details: Slow steady gait with verbal cues for sequencing and for maintaining safe distance from front of walker.  Stairs            Wheelchair Mobility    Modified Rankin (Stroke Patients Only)       Balance Overall  balance assessment: Needs assistance Sitting-balance support: Single extremity supported;Feet supported Sitting balance-Leahy Scale: Good     Standing balance support: Bilateral upper extremity supported;During functional activity                                 Pertinent Vitals/Pain Pain Assessment: 0-10 Pain Score: 7  Pain Location: "My whole L leg." Pain Descriptors / Indicators: Aching;Discomfort;Guarding Pain Intervention(s): Limited activity within patient's tolerance;Monitored during session;Premedicated before session    Home Living Family/patient expects to be discharged to:: Private residence Living Arrangements: Alone Available Help at Discharge: Family;Home health (for 1 week) Type of Home: Apartment Home Access: Stairs to enter;Other (comment) (chair lift) Entrance Stairs-Rails: Left Entrance Stairs-Number of Steps: 12 Home Layout: One level Home Equipment: Toilet riser;Walker - 2 wheels;Cane - single point;Shower seat - built in      Prior Function Level of Independence: Independent with assistive device(s)         Comments: chair lift for home stairs     Hand Dominance        Extremity/Trunk Assessment   Upper Extremity Assessment: Overall WFL for tasks assessed           Lower Extremity Assessment: Overall WFL for tasks assessed;LLE deficits/detail   LLE Deficits / Details: able to SLR x5 with CGA     Communication   Communication: No difficulties  Cognition Arousal/Alertness: Awake/alert Behavior During Therapy: WFL for tasks assessed/performed Overall Cognitive  Status: Within Functional Limits for tasks assessed                      General Comments      Exercises Total Joint Exercises Ankle Circles/Pumps: AROM;Strengthening;Both;10 reps Quad Sets: Strengthening;Both;10 reps Gluteal Sets: Strengthening;Both;10 reps Heel Slides: AAROM;Both;10 reps Hip ABduction/ADduction: AROM;Both;10 reps Straight Leg  Raises: Strengthening;Both;5 reps Goniometric ROM: Seated L knee flexion: grossly 60 degrees, bulky surgical dressing in place, dangling, grravity and gentle overpressure assisted      Assessment/Plan    PT Assessment Patient needs continued PT services  PT Diagnosis Difficulty walking;Generalized weakness;Acute pain   PT Problem List Decreased strength;Decreased range of motion;Decreased activity tolerance;Decreased balance;Decreased mobility;Decreased knowledge of use of DME;Decreased safety awareness;Pain  PT Treatment Interventions DME instruction;Gait training;Functional mobility training;Therapeutic activities;Therapeutic exercise;Balance training;Patient/family education   PT Goals (Current goals can be found in the Care Plan section) Acute Rehab PT Goals Patient Stated Goal: To go home. PT Goal Formulation: With patient Time For Goal Achievement: 12/25/15 Potential to Achieve Goals: Good    Frequency BID   Barriers to discharge        Co-evaluation               End of Session Equipment Utilized During Treatment: Gait belt Activity Tolerance: Patient tolerated treatment well Patient left: in chair;with call bell/phone within reach;with chair alarm set Nurse Communication: Mobility status         Time: 0940-1020 PT Time Calculation (min) (ACUTE ONLY): 40 min   Charges:   PT Evaluation $PT Eval Moderate Complexity: 1 Procedure PT Treatments $Gait Training: 8-22 mins $Therapeutic Exercise: 8-22 mins   PT G Codes:        Ramello Cordial A Tammee Thielke Jan 07, 2016, 10:28 AM

## 2015-12-18 NOTE — Progress Notes (Signed)
Pt. C/o hemorrhoids. Dr. Rudene Christians notified and ordered preparation H tid prn.

## 2015-12-18 NOTE — Progress Notes (Signed)
Clinical Social Worker (CSW) received SNF consult. PT is recommending home health. RN Case Manager is aware of above. Please reconsult if future social work needs arise. CSW signing off.   Aristotle Lieb Morgan, LCSW [phone] 

## 2015-12-18 NOTE — Anesthesia Postprocedure Evaluation (Signed)
Anesthesia Post Note  Patient: Jo Malone  Procedure(s) Performed: Procedure(s) (LRB): COMPUTER ASSISTED TOTAL KNEE ARTHROPLASTY (Left)  Patient location during evaluation: Nursing Unit Anesthesia Type: Spinal Level of consciousness: awake and alert and oriented Pain management: satisfactory to patient Vital Signs Assessment: post-procedure vital signs reviewed and stable Respiratory status: spontaneous breathing and respiratory function stable Cardiovascular status: stable Postop Assessment: no backache, no headache, spinal receding, patient able to bend at knees, no signs of nausea or vomiting and adequate PO intake Anesthetic complications: no    Last Vitals:  Filed Vitals:   12/18/15 0510 12/18/15 0833  BP: 140/58 106/50  Pulse: 77 78  Temp: 36.8 C 37.1 C  Resp: 18 20    Last Pain:  Filed Vitals:   12/18/15 1318  PainSc: 3                  Blima Singer

## 2015-12-18 NOTE — Progress Notes (Signed)
OT Cancellation Note  Patient Details Name: Jo Malone MRN: [id number] DOB: Oct 10, 1935   Cancelled Treatment:     Attempted to see patient 2 times in the afternoon, 1st attempt patient just got back to bed after PT.  Returned at 1600 and patient declines, has visitors. Will continue attempts for evaluation.   Makendra Vigeant T Ruel Dimmick, OTR/L, CLT   Jo Malone 12/18/2015, 4:11 PM

## 2015-12-18 NOTE — Progress Notes (Signed)
Physical Therapy Treatment Patient Details Name: Jo Malone MRN: [id number] DOB: Aug 23, 1936 Today's Date: 12/18/2015    History of Present Illness Pt is a 80 yo female POD#1 L TKA.    PT Comments    Pt tolerated sitting up in chair since a.m. Session and agreeable to PT this date.  Pt able to increase gait distance to 12' with RW and slow steady cadence.  Pt leaning heavily on RW and required verbal cues to increase weight acceptance on L LE.  Pt able to get into bed with supervision and scoot self up in bed easily.  Pt with increased quad strength this session able to perform a SLR without assist.  Cont with POC.   Follow Up Recommendations  Home health PT     Equipment Recommendations  Rolling walker with 5" wheels;3in1 (PT)    Recommendations for Other Services       Precautions / Restrictions Precautions Precautions: Knee Precaution Booklet Issued: Yes (comment) Restrictions Weight Bearing Restrictions: Yes LLE Weight Bearing: Weight bearing as tolerated    Mobility  Bed Mobility Overal bed mobility: Needs Assistance Bed Mobility: Sit to Supine     Supine to sit: Min guard     General bed mobility comments: Able to lift L LE onto bed with CGA, scoots up in bed in Jo Malone sit position.  Overall good body mechanics and control.  Transfers Overall transfer level: Needs assistance Equipment used: Rolling walker (2 wheeled) Transfers: Sit to/from Stand Sit to Stand: Supervision         General transfer comment: verbal cues to push up from arm rests on recliner, good balance with weight shift to RW.  Ambulation/Gait Ambulation/Gait assistance: Min guard Ambulation Distance (Feet): 36 Feet Assistive device: Rolling walker (2 wheeled) Gait Pattern/deviations: Step-to pattern     General Gait Details: Pt initially with decreased weight bearing on L LE, able to increase with cues during gait session, pt c/o hand pain at end of session due to heavy lean on  RW   Stairs            Wheelchair Mobility    Modified Rankin (Stroke Patients Only)       Balance                                    Cognition Arousal/Alertness: Awake/alert Behavior During Therapy: WFL for tasks assessed/performed Overall Cognitive Status: Within Functional Limits for tasks assessed                      Exercises Total Joint Exercises Ankle Circles/Pumps: AROM;Strengthening;Both;10 reps Quad Sets: Strengthening;Both;10 reps Gluteal Sets: Strengthening;Both;10 reps Heel Slides: AROM;Both;10 reps Hip ABduction/ADduction: AROM;Both;10 reps Straight Leg Raises: Strengthening;Both;5 reps    General Comments        Pertinent Vitals/Pain Pain Location: with mobility, not rated, decreases wtih rest    Home Living                      Prior Function            PT Goals (current goals can now be found in the care plan section) Acute Rehab PT Goals Patient Stated Goal: To go home. PT Goal Formulation: With patient Time For Goal Achievement: 12/25/15 Potential to Achieve Goals: Good Progress towards PT goals: Progressing toward goals    Frequency  BID    PT Plan  Current plan remains appropriate    Co-evaluation             End of Session Equipment Utilized During Treatment: Gait belt Activity Tolerance: Patient tolerated treatment well Patient left: in bed;with call bell/phone within reach;with bed alarm set     Time: 1401-1430 PT Time Calculation (min) (ACUTE ONLY): 29 min  Charges:  $Gait Training: 8-22 mins $Therapeutic Exercise: 8-22 mins                    G Codes:      Jo Malone, PT Jan 05, 2016, 2:37 PM

## 2015-12-19 LAB — CBC
HCT: 32.1 % — ABNORMAL LOW (ref 35.0–47.0)
HEMOGLOBIN: 10.7 g/dL — AB (ref 12.0–16.0)
MCH: 28.7 pg (ref 26.0–34.0)
MCHC: 33.4 g/dL (ref 32.0–36.0)
MCV: 85.9 fL (ref 80.0–100.0)
Platelets: 179 10*3/uL (ref 150–440)
RBC: 3.74 MIL/uL — AB (ref 3.80–5.20)
RDW: 14.1 % (ref 11.5–14.5)
WBC: 8.9 10*3/uL (ref 3.6–11.0)

## 2015-12-19 LAB — BASIC METABOLIC PANEL
Anion gap: 4 — ABNORMAL LOW (ref 5–15)
BUN: 14 mg/dL (ref 6–20)
CHLORIDE: 111 mmol/L (ref 101–111)
CO2: 23 mmol/L (ref 22–32)
Calcium: 8.7 mg/dL — ABNORMAL LOW (ref 8.9–10.3)
Creatinine, Ser: 0.8 mg/dL (ref 0.44–1.00)
GFR calc Af Amer: >60 mL/min (ref >60)
GFR calc non Af Amer: >60 mL/min (ref >60)
Glucose, Bld: 125 mg/dL — ABNORMAL HIGH (ref 65–99)
Potassium: 4 mmol/L (ref 3.5–5.1)
Sodium: 138 mmol/L (ref 135–145)

## 2015-12-19 NOTE — Progress Notes (Signed)
Discharge instructions given to patient and her daughter with verbalization of understanding. Lovenox instructions given to daughter. Dressing changed. IV discontinued without difficulty. Patient wheeled out via wheelchair by nurse techs.

## 2015-12-19 NOTE — Progress Notes (Signed)
   12/19/15 1400  Clinical Encounter Type  Visited With Patient  Visit Type Initial  Consult/Referral To Chaplain  Chaplain rounded on unit and offered pastoral care.   [place] [phone]

## 2015-12-19 NOTE — Care Management Important Message (Signed)
Important Message  Patient Details  Name: [name] MRN: [id number] Date of Birth: 1936-08-23   Medicare Important Message Given:  Yes    Leshon Armistead A, RN 12/19/2015, 6:55 AM

## 2015-12-19 NOTE — Progress Notes (Signed)
Physical Therapy Treatment Patient Details Name: Jo Malone MRN: [id number] DOB: [birth year] Today's Date: 12/19/2015    History of Present Illness Pt is a 80 yo female s/p  L TKA.    PT Comments    Pt does well with PT session and though she has some pain and fatigue with the effort she is able to improve ambulation distance and gait, shows increased strength with exercises and generally is making expected post-op gains.    Follow Up Recommendations  Home health PT     Equipment Recommendations  Rolling walker with 5" wheels;3in1 (PT)    Recommendations for Other Services       Precautions / Restrictions Precautions Precautions: Fall Restrictions LLE Weight Bearing: Weight bearing as tolerated    Mobility  Bed Mobility Overal bed mobility: Needs Assistance Bed Mobility: Sit to Supine     Supine to sit: Min guard (pt needing hand rails)        Transfers Overall transfer level: Modified independent Equipment used: Rolling walker (2 wheeled) Transfers: Sit to/from Stand Sit to Stand: Min guard         General transfer comment: Pt able to rise w/o physical assist, does need minimal VCs/reminders about sequencing and hand placement  Ambulation/Gait Ambulation/Gait assistance: Min guard Ambulation Distance (Feet): 85 Feet Assistive device: Rolling walker (2 wheeled)       General Gait Details: Pt shows increased confidence with ambulation today and was able to increase speed and distance.  She has no LOBs or other safety concerns and generally does well.    Stairs            Wheelchair Mobility    Modified Rankin (Stroke Patients Only)       Balance                                    Cognition Arousal/Alertness: Awake/alert Behavior During Therapy: WFL for tasks assessed/performed Overall Cognitive Status: Within Functional Limits for tasks assessed                      Exercises Total Joint Exercises Ankle  Circles/Pumps: AROM;Strengthening;Malone;10 reps Quad Sets: Strengthening;Malone;10 reps Gluteal Sets: Strengthening;Malone;10 reps Short Arc Quad: AROM;10 reps Heel Slides: AROM;10 reps;Strengthening Hip ABduction/ADduction: Strengthening;10 reps Straight Leg Raises: AROM;10 reps Knee Flexion: PROM;5 reps Goniometric ROM: 0-88    General Comments        Pertinent Vitals/Pain Pain Assessment: 0-10 Pain Score: 5  (increases with exercises, movement)    Home Living                      Prior Function            PT Goals (current goals can now be found in the care plan section) Progress towards PT goals: Progressing toward goals    Frequency  BID    PT Plan Current plan remains appropriate    Co-evaluation             End of Session Equipment Utilized During Treatment: Gait belt Activity Tolerance: Patient tolerated treatment well Patient left: in bed;with call bell/phone within reach;with bed alarm set     Time: [id number] PT Time Calculation (min) (ACUTE ONLY): 29 min  Charges:  $Gait Training: 8-22 mins $Therapeutic Exercise: 8-22 mins  G Codes:     Jo Malone, PT, DPT [phone]  Jo Malone 12/19/2015, 11:28 AM

## 2015-12-19 NOTE — Discharge Summary (Signed)
Physician Discharge Summary  Patient ID: JARIELIZ RAQUET MRN: [id number] DOB/AGE: 04-10-1936 80 y.o.  Admit date: 12/17/2015 Discharge date: 12/19/2015  Admission Diagnoses:  OSTEOARTHRITIS  Primary degenerative arthrosis of the left knee  Discharge Diagnoses: Patient Active Problem List   Diagnosis Date Noted  . S/P total knee arthroplasty 12/17/2015  Primary degenerative arthrosis of the left knee  Past Medical History  Diagnosis Date  . Anxiety   . Neuropathy ([place])   . Asthma   . GERD (gastroesophageal reflux disease)   . Arthritis      Transfusion: None   Consultants (if any):    Discharged Condition: Improved  Hospital Course: EDILIA NEWBROUGH is an 80 y.o. female who was admitted 12/17/2015 with a diagnosis of <principal problem not specified> and went to the operating room on 12/17/2015 and underwent the above named procedures.    Surgeries: Procedure(s): COMPUTER ASSISTED TOTAL KNEE ARTHROPLASTY on 12/17/2015 Patient tolerated the surgery well. Taken to PACU where she was stabilized and then transferred to the orthopedic floor.  Started on Lovenox 30mg  q 12 hrs. Foot pumps applied bilaterally at 80 mm. Heels elevated on bed with rolled towels. No evidence of DVT. Negative [place]oman. Physical therapy started on day #1 for gait training and transfer. OT started day #1 for ADL and assisted devices.  Patient's IV and foley were removed on POD1 Hemovac removed on POD2  Implants: DePuy Attune size 5 posterior stabilized femoral component (cemented), size 5 rotating platform tibial component (cemented), 35 mm medialized dome patella (cemented), and a 6 mm stabilized rotating platform polyethylene insert.  She was given perioperative antibiotics:  Anti-infectives    Start     Dose/Rate Route Frequency Ordered Stop   12/17/15 1800  clindamycin (CLEOCIN) IVPB 600 mg     600 mg 100 mL/hr over 30 Minutes Intravenous Every 6 hours 12/17/15 1624 12/18/15 0828   12/17/15 0655   clindamycin (CLEOCIN) 900 MG/50ML IVPB    Comments:  HENRY, LOIS: cabinet override      12/17/15 0655 12/17/15 1859   12/17/15 0400  vancomycin (VANCOCIN) IVPB 1000 mg/200 mL premix     1,000 mg 200 mL/hr over 60 Minutes Intravenous  Once 12/17/15 0349 12/17/15 1144   12/17/15 0400  clindamycin (CLEOCIN) IVPB 900 mg     900 mg 100 mL/hr over 30 Minutes Intravenous  Once 12/17/15 0349 12/17/15 1211    . She was given sequential compression devices, early ambulation, and Lovenox for DVT prophylaxis.  She benefited maximally from the hospital stay and there were no complications.    Recent vital signs:  Filed Vitals:   12/19/15 0417 12/19/15 0834  BP: 146/60 122/66  Pulse: 102 87  Temp: 99.2 F (37.3 C) 98.5 F (36.9 C)  Resp: 16 18    Recent laboratory studies:  Lab Results  Component Value Date   HGB 10.7* 12/19/2015   HGB 10.4* 12/18/2015   HGB 13.3 12/17/2015   Lab Results  Component Value Date   WBC 8.9 12/19/2015   PLT 179 12/19/2015   Lab Results  Component Value Date   INR 1.00 12/03/2015   Lab Results  Component Value Date   NA 138 12/19/2015   K 4.0 12/19/2015   CL 111 12/19/2015   CO2 23 12/19/2015   BUN 14 12/19/2015   CREATININE 0.80 12/19/2015   GLUCOSE 125* 12/19/2015    Discharge Medications:     Medication List    TAKE these medications  ALPRAZolam 0.25 MG tablet  Commonly known as:  XANAX  Take 0.25 mg by mouth 3 (three) times daily as needed for anxiety.     enoxaparin 40 MG/0.4ML injection  Commonly known as:  LOVENOX  Inject 0.4 mLs (40 mg total) into the skin daily.     furosemide 40 MG tablet  Commonly known as:  LASIX  Take 40 mg by mouth daily as needed.     gabapentin 600 MG tablet  Commonly known as:  NEURONTIN  Take 300 mg by mouth 2 (two) times daily. One half tablet after lunch and one half tablet at bedtime     meloxicam 7.5 MG tablet  Commonly known as:  MOBIC  Take 7.5 mg by mouth 2 (two) times daily as  needed for pain. Reported on 12/17/2015     montelukast 10 MG tablet  Commonly known as:  SINGULAIR  Take 10 mg by mouth at bedtime.     oxyCODONE 5 MG immediate release tablet  Commonly known as:  Oxy IR/ROXICODONE  Take 1-2 tablets (5-10 mg total) by mouth every 4 (four) hours as needed for severe pain or breakthrough pain.     Potassium 99 MG Tabs  Take 1 tablet by mouth daily.     PROAIR HFA 108 (90 Base) MCG/ACT inhaler  Generic drug:  albuterol  Inhale 2 puffs into the lungs 2 (two) times daily as needed for wheezing or shortness of breath.     albuterol (2.5 MG/3ML) 0.083% nebulizer solution  Commonly known as:  PROVENTIL  Take 2.5 mg by nebulization every 6 (six) hours as needed for wheezing or shortness of breath.     SYMBICORT 160-4.5 MCG/ACT inhaler  Generic drug:  budesonide-formoterol  Inhale 2 puffs into the lungs 2 (two) times daily as needed.     traMADol 50 MG tablet  Commonly known as:  ULTRAM  Take 1-2 tablets (50-100 mg total) by mouth every 4 (four) hours as needed for moderate pain.     VITAMIN B-12 CR PO  Take 1,000 mg by mouth daily.     VITAMIN D-3 PO  Take 5,000 Units by mouth daily.     zolpidem 10 MG tablet  Commonly known as:  AMBIEN  Take 10 mg by mouth at bedtime as needed for sleep.        Diagnostic Studies: Dg Knee Left Port  12/17/2015  CLINICAL DATA:  Postop left total knee arthroplasty. EXAM: PORTABLE LEFT KNEE - 1-2 VIEW COMPARISON:  None. FINDINGS: Status post left total knee arthroplasty. The hardware is well positioned. No evidence of acute fracture or dislocation. There are 2 surgical drains in place. Anterior skin staples are noted. A small amount of gas is present within the joint and surrounding soft tissues. IMPRESSION: No demonstrated complication following left total knee arthroplasty. Electronically Signed   By: Richardean Sale M.D.   On: 12/17/2015 15:18   Disposition: Pt is stable and ready for discharge, pending progress  with physical therapy.  Pt has had a bowel movement and pain is controlled.  Continue Lovenox 40mg  daily for 14 days.  Follow-up in two weeks for staple removal and skin check.  Repeat x-rays of the left knee in 6 weeks.      Discharge Instructions    Diet - low sodium heart healthy    Complete by:  As directed      Increase activity slowly    Complete by:  As directed  Follow-up Information    Follow up with [facility] R[facility] PA On 01/01/2016.   Specialty:  Physician Assistant   Why:  at 9:15am   Contact information:   [address] [facility] [place] [address] [phone]       Follow up with Dereck Leep, MD On 01/29/2016.   Specialty:  Orthopedic Surgery   Why:  at 9:15am   Contact information:   [place] [address] [phone]      Signed: Judson Roch PA-C 12/19/2015, 9:20 AM

## 2015-12-19 NOTE — Progress Notes (Signed)
   Subjective: 2 Days Post-Op Procedure(s) (LRB): COMPUTER ASSISTED TOTAL KNEE ARTHROPLASTY (Left) Patient reports pain as mild.   Patient is well, and has had no acute complaints or problems We will continue therapy today. Plan is to go Home after hospital stay. no nausea and no vomiting Patient denies any chest pains or shortness of breath. Objective: Vital signs in last 24 hours: Temp:  [97.3 F (36.3 C)-99.2 F (37.3 C)] 98.5 F (36.9 C) (04/14 0834) Pulse Rate:  [72-102] 87 (04/14 0834) Resp:  [16-18] 18 (04/14 0834) BP: (122-152)/(60-68) 122/66 mmHg (04/14 0834) SpO2:  [92 %-99 %] 94 % (04/14 0834) Heels are non tender and elevated off the bed using rolled towels Intake/Output from previous day: 04/13 0701 - 04/14 0700 In: -  Out: 300 [Urine:200; Drains:100] Intake/Output this shift:     Recent Labs  12/17/15 1032 12/18/15 0618 12/19/15 0533  HGB 13.3 10.4* 10.7*    Recent Labs  12/18/15 0618 12/19/15 0533  WBC 7.2 8.9  RBC 3.70* 3.74*  HCT 31.5* 32.1*  PLT 190 179    Recent Labs  12/18/15 0618 12/19/15 0533  NA 136 138  K 3.6 4.0  CL 107 111  CO2 25 23  BUN 16 14  CREATININE 1.03* 0.80  GLUCOSE 128* 125*  CALCIUM 8.3* 8.7*   No results for input(s): LABPT, INR in the last 72 hours.  EXAM General - Patient is Alert, Appropriate and Oriented Extremity - Neurologically intact Neurovascular intact Sensation intact distally Intact pulses distally Dorsiflexion/Plantar flexion intact Dressing - scant drainage, bloody drainage. Motor Function - intact, moving foot and toes well on exam. Patient was able to perform a brief straight leg raise today.  Bulky dressing removed today. Hemovac removed, mild bloody drainage.  4x4 applied to the site, tegaderm placed over.  Past Medical History  Diagnosis Date  . Anxiety   . Neuropathy ([place])   . Asthma   . GERD (gastroesophageal reflux disease)   . Arthritis     Assessment/Plan: 2 Days Post-Op  Procedure(s) (LRB): COMPUTER ASSISTED TOTAL KNEE ARTHROPLASTY (Left) Active Problems:   S/P total knee arthroplasty  Estimated body mass index is 31.89 kg/(m^2) as calculated from the following:   Height as of this encounter: 5\' 3"  (1.6 m).   Weight as of this encounter: 81.647 kg (180 lb). Advance diet Up with therapy  Plan for potential discharge home today with homehealth PT depending on how therapy continues for the patient.  Pt had 5 BM yesterday.  Denies any history of antibiotic usage, states it was loose and not watery.  I believe this is due to medications given for her to have a BM following surgery. Labs: Were reviewed and within normal limits DVT Prophylaxis - Lovenox, Foot Pumps and TED hose Weight-Bearing as tolerated to left leg  Jon R. Norton [place] 12/19/2015, 9:13 AM

## 2015-12-19 NOTE — Care Management Note (Addendum)
Case Management Note  Patient Details  Name: BRIANN LEGER MRN: [id number] Date of Birth: June 14, 1936  Subjective/Objective:      A referral for Home Health PT/OT was called to [facility] at [facility]. Awaiting discharge to home. Discharge order in computer.             Action/Plan:   Expected Discharge Date:                  Expected Discharge Plan:     In-House Referral:     Discharge planning Services     Post Acute Care Choice:    Choice offered to:     DME Arranged:    DME Agency:     HH Arranged:    [place] Agency:     Status of Service:     Medicare Important Message Given:  Yes Date Medicare IM Given:    Medicare IM give by:    Date Additional Medicare IM Given:    Additional Medicare Important Message give by:     If discussed at [place] of Stay Meetings, dates discussed:    Additional Comments:  Charl[place] Hankin A, RN 12/19/2015, 2:58 PM

## 2015-12-19 NOTE — Evaluation (Signed)
Occupational Therapy Evaluation Patient Details Name: Jo Malone MRN: [id number] DOB: 21-Jan-1936 Today's Date: 12/19/2015    History of Present Illness Pt is a 80 yo female s/p  L TKA.     Clinical Impression  Pt is 80 year old female s/p Left TKR.  Pt was independent in all ADLs prior to surgery and is eager to return to PLOF.  Pt currently requires moderate assist for LB dressing while in seated position due to pain and limited AROM of L knee.  Pt would benefit from instruction in modified techniques with or without assistive devices for LE ADLs. Pt would also benefit from recommendations for home modifications to increase safety in the bathroom and prevent falls. Pt. [facility] been[facility] from [facility] follow[facility] services.    Follow Up Recommendations  Home health OT    Equipment Recommendations       Recommendations for Other Services       Precautions / Restrictions Precautions Precautions: Fall Precaution Booklet Issued: Yes (comment) Restrictions Weight Bearing Restrictions: Yes LLE Weight Bearing: Weight bearing as tolerated      Mobility Bed Mobility  Pt. Up in chair upon arrival.   Balance                                            ADL Overall ADL's : Needs assistance/impaired Eating/Feeding: Set up   Grooming: Minimal assistance               Lower Body Dressing: Moderate assistance;With adaptive equipment                       Vision     Perception     Praxis      Pertinent Vitals/Pain Pain Assessment: 0-10 Pain Score: 5  Pain Descriptors / Indicators: Discomfort     Hand Dominance     Extremity/Trunk Assessment Upper Extremity Assessment Upper Extremity Assessment: Overall WFL for tasks assessed           Communication Communication Communication: No difficulties   Cognition Arousal/Alertness: Awake/alert Behavior During Therapy: WFL for tasks assessed/performed Overall Cognitive Status: Within  Functional Limits for tasks assessed                     General Comments       Exercises Exercises: Total Joint     Shoulder Instructions      Home Living Family/patient expects to be discharged to:: Private residence Living Arrangements: Alone Available Help at Discharge: Family;Home health Type of Home: Apartment Home Access: Stairs to enter   Entrance Stairs-Rails: Left Home Layout: One level     Bathroom Shower/Tub: Occupational psychologist: [place]: Toilet riser;Cane - single point          Prior Functioning/Environment Level of Independence: Independent with assistive device(s)        Comments: chair lift for home stairs    OT Diagnosis: Generalized weakness   OT Problem List: Decreased strength;Decreased activity tolerance;Pain   OT Treatment/Interventions: Self-care/ADL training;Therapeutic exercise;DME and/or AE instruction;Therapeutic activities;Patient/family education    OT Goals(Current goals can be found in the care plan section) Acute Rehab OT Goals Patient Stated Goal: To go home. OT Goal Formulation: With patient  OT Frequency: Min 2X/week   Barriers to D/C:  Co-evaluation              End of Session    Activity Tolerance: Patient tolerated treatment well Patient left: in chair;with call bell/phone within reach;with chair alarm set   Time: 1035-1105 OT Time Calculation (min): 30 min Charges:  OT General Charges $OT Visit: 1 Procedure OT Evaluation $OT Eval Low Complexity: 1 Procedure OT Treatments $Self Care/Home Management : 8-22 mins G-Codes:    Harrel Carina, MS, OTR/L Harrel Carina 12/19/2015, 12:06 PM

## 2016-02-25 ENCOUNTER — Ambulatory Visit: Payer: Self-pay | Admitting: Family Medicine

## 2017-02-22 ENCOUNTER — Encounter: Payer: Self-pay | Admitting: Emergency Medicine

## 2017-02-22 ENCOUNTER — Emergency Department: Payer: Medicare Other

## 2017-02-22 ENCOUNTER — Emergency Department
Admission: EM | Admit: 2017-02-22 | Discharge: 2017-02-22 | Disposition: A | Discharge status: Home / Self Care | Payer: Medicare Other | Attending: Emergency Medicine | Admitting: Emergency Medicine

## 2017-02-22 DIAGNOSIS — K2970 Gastritis, unspecified, without bleeding: Secondary | ICD-10-CM | POA: Insufficient documentation

## 2017-02-22 DIAGNOSIS — R1010 Upper abdominal pain, unspecified: Secondary | ICD-10-CM | POA: Diagnosis present

## 2017-02-22 DIAGNOSIS — Z79899 Other long term (current) drug therapy: Secondary | ICD-10-CM | POA: Diagnosis not present

## 2017-02-22 DIAGNOSIS — J45909 Unspecified asthma, uncomplicated: Secondary | ICD-10-CM | POA: Diagnosis not present

## 2017-02-22 DIAGNOSIS — Z96652 Presence of left artificial knee joint: Secondary | ICD-10-CM | POA: Diagnosis not present

## 2017-02-22 LAB — URINALYSIS, COMPLETE (UACMP) WITH MICROSCOPIC
Bilirubin Urine: NEGATIVE
Glucose, UA: NEGATIVE mg/dL
HGB URINE DIPSTICK: NEGATIVE
Ketones, ur: NEGATIVE mg/dL
Leukocytes, UA: NEGATIVE
NITRITE: NEGATIVE
PROTEIN: 30 mg/dL — AB
Specific Gravity, Urine: 1.028 (ref 1.005–1.030)
pH: 5 (ref 5.0–8.0)

## 2017-02-22 LAB — CBC
HEMATOCRIT: 37.6 % (ref 35.0–47.0)
HEMOGLOBIN: 12.6 g/dL (ref 12.0–16.0)
MCH: 28.9 pg (ref 26.0–34.0)
MCHC: 33.6 g/dL (ref 32.0–36.0)
MCV: 86.1 fL (ref 80.0–100.0)
Platelets: 212 10*3/uL (ref 150–440)
RBC: 4.37 MIL/uL (ref 3.80–5.20)
RDW: 14.6 % — ABNORMAL HIGH (ref 11.5–14.5)
WBC: 7.7 10*3/uL (ref 3.6–11.0)

## 2017-02-22 LAB — COMPREHENSIVE METABOLIC PANEL
ALBUMIN: 3.9 g/dL (ref 3.5–5.0)
ALT: 24 U/L (ref 14–54)
ANION GAP: 9 (ref 5–15)
AST: 42 U/L — ABNORMAL HIGH (ref 15–41)
Alkaline Phosphatase: 74 U/L (ref 38–126)
BUN: 17 mg/dL (ref 6–20)
CHLORIDE: 111 mmol/L (ref 101–111)
CO2: 22 mmol/L (ref 22–32)
Calcium: 9.2 mg/dL (ref 8.9–10.3)
Creatinine, Ser: 0.71 mg/dL (ref 0.44–1.00)
GFR calc Af Amer: >60 mL/min (ref >60)
GFR calc non Af Amer: >60 mL/min (ref >60)
GLUCOSE: 104 mg/dL — AB (ref 65–99)
POTASSIUM: 3.7 mmol/L (ref 3.5–5.1)
Sodium: 142 mmol/L (ref 135–145)
TOTAL PROTEIN: 7.4 g/dL (ref 6.5–8.1)
Total Bilirubin: 0.4 mg/dL (ref 0.3–1.2)

## 2017-02-22 LAB — LIPASE, BLOOD: LIPASE: 23 U/L (ref 11–51)

## 2017-02-22 MED ORDER — PANTOPRAZOLE SODIUM 40 MG PO TBEC: 40.0000 mg | DELAYED_RELEASE_TABLET | Freq: Every day | ORAL | 1 refills | Status: AC

## 2017-02-22 MED ORDER — ALUM & MAG HYDROXIDE-SIMETH 200-200-20 MG/5ML PO SUSP: 15.0000 mL | Freq: Three times a day (TID) | ORAL | 0 refills | Status: DC

## 2017-02-22 NOTE — ED Triage Notes (Signed)
Pt states abd pain for the past 3 weeks, has had diarrhea off and on for 3 weeks also. States pain is not in one specific place in her stomach, denies fever and vomiting, has had some nausea.

## 2017-02-22 NOTE — ED Notes (Signed)
Pt unable to urinate at this moment, given specimen cup for when is able to void. Pt sent back out to lobby.

## 2017-02-22 NOTE — ED Provider Notes (Signed)
[facility] Emergency Department Provider Note  Time seen: 3:37 PM  I have reviewed the triage vital signs and the nursing notes.   HISTORY  Chief Complaint Abdominal Pain    HPI Jo Malone is a 81 y.o. female with a past medical history of anxiety, arthritis, gastric reflux, presents to the emergency department for upper abdominal discomfort. According to the patient for the past 3 weeks she has been experiencing upper abdominal pain which she states is fairly constant. She also has intermittent diarrhea over the same time frame as well. She was seen by her primary care doctor approximately 3 weeks ago had an ultrasound performed, per patient was positive for gallbladder disease and patient is currently [place] visiting family but states the pain had worsened so she came to the emergency department for evaluation. Denies any fever. Denies dysuria. Denies black or bloody stool. Describes her pain as moderate located across the entire upper abdomen.  Past Medical History:  Diagnosis Date  . Anxiety   . Arthritis   . Asthma   . GERD (gastroesophageal reflux disease)   . Neuropathy     Patient Active Problem List   Diagnosis Date Noted  . S/P total knee arthroplasty 12/17/2015    Past Surgical History:  Procedure Laterality Date  . ABDOMINAL HYSTERECTOMY    . APPENDECTOMY    . BACK SURGERY     metal plate and screws  . BREAST SURGERY     implants  . CATARACT EXTRACTION, BILATERAL    . COLONOSCOPY    . DILATION AND CURETTAGE OF UTERUS    . FOOT SURGERY Bilateral   . HEMORRHOID SURGERY    . JOINT REPLACEMENT    . KNEE ARTHROPLASTY Left 12/17/2015   Procedure: COMPUTER ASSISTED TOTAL KNEE ARTHROPLASTY;  Surgeon: Dereck Leep, MD;  Location: [facility] ORS;  Service: Orthopedics;  Laterality: Left;  . TUBAL LIGATION    . VARICOSE VEIN SURGERY      Prior to Admission medications   Medication Sig Start Date End Date Taking? Authorizing Provider   albuterol (PROAIR HFA) 108 (90 Base) MCG/ACT inhaler Inhale 2 puffs into the lungs 2 (two) times daily as needed for wheezing or shortness of breath.    [provider]  albuterol (PROVENTIL) (2.5 MG/3ML) 0.083% nebulizer solution Take 2.5 mg by nebulization every 6 (six) hours as needed for wheezing or shortness of breath.    [provider]  ALPRAZolam Duanne Moron) 0.25 MG tablet Take 0.25 mg by mouth 3 (three) times daily as needed for anxiety.    [provider]  budesonide-formoterol (SYMBICORT) 160-4.5 MCG/ACT inhaler Inhale 2 puffs into the lungs 2 (two) times daily as needed.    [provider]  Cholecalciferol (VITAMIN D-3 PO) Take 5,000 Units by mouth daily.    [provider]  Cyanocobalamin (VITAMIN B-12 CR PO) Take 1,000 mg by mouth daily.    [provider]  enoxaparin (LOVENOX) 40 MG/0.4ML injection Inject 0.4 mLs (40 mg total) into the skin daily. 12/18/15   Watt Climes, PA  furosemide (LASIX) 40 MG tablet Take 40 mg by mouth daily as needed.     [provider]  gabapentin (NEURONTIN) 600 MG tablet Take 300 mg by mouth 2 (two) times daily. One half tablet after lunch and one half tablet at bedtime    [provider]  meloxicam (MOBIC) 7.5 MG tablet Take 7.5 mg by mouth 2 (two) times daily as needed for pain.  Reported on 12/17/2015    [provider]  montelukast (SINGULAIR) 10 MG tablet Take 10 mg by mouth at bedtime.    [provider]  oxyCODONE (OXY IR/ROXICODONE) 5 MG immediate release tablet Take 1-2 tablets (5-10 mg total) by mouth every 4 (four) hours as needed for severe pain or breakthrough pain. 12/18/15   Watt Climes, PA  Potassium 99 MG TABS Take 1 tablet by mouth daily.    [provider]  traMADol (ULTRAM) 50 MG tablet Take 1-2 tablets (50-100 mg total) by mouth every 4 (four) hours as needed for moderate pain. 12/18/15   Watt Climes, PA  zolpidem (AMBIEN) 10 MG tablet Take 10  mg by mouth at bedtime as needed for sleep.    [provider]    Allergies  Allergen Reactions  . Penicillins Hives    No family history on file.  Social History Social History  Substance Use Topics  . Smoking status: Never Smoker  . Smokeless tobacco: Never Used  . Alcohol use No    Review of Systems Constitutional: Negative for fever. Cardiovascular: Negative for chest pain. Respiratory: Negative for shortness of breath. Gastrointestinal: Positive for upper abdominal discomfort. Positive for nausea. Negative for vomiting or diarrhea. Genitourinary: Negative for dysuria. Musculoskeletal: Negative for back pain. Skin: Negative for rash. Neurological: Negative for headache All other ROS negative  ____________________________________________   PHYSICAL EXAM:  VITAL SIGNS: ED Triage Vitals [02/22/17 1425]  Enc Vitals Group     BP (!) 152/79     Pulse Rate 83     Resp 18     Temp 98.4 F (36.9 C)     Temp Source Oral     SpO2 96 %     Weight 155 lb (70.3 kg)     Height 5\' 3"  (1.6 m)     Head Circumference      Peak Flow      Pain Score 10     Pain Loc      Pain Edu?      Excl. in [place]?    Constitutional: Alert and oriented. Well appearing and in no distress. Eyes: Normal exam ENT   Head: Normocephalic and atraumatic.   Mouth/Throat: Mucous membranes are moist. Cardiovascular: Normal rate, regular rhythm. No murmur Respiratory: Normal respiratory effort without tachypnea nor retractions. Breath sounds are clear Gastrointestinal: Soft, mild upper abdominal pain/tenderness including right upper quadrant tenderness. No rebound or guarding. No distention. Abdomen otherwise benign. No lower abdominal tenderness. Musculoskeletal: Nontender with normal range of motion in all extremities.  Neurologic:  Normal speech and language. No gross focal neurologic deficits Skin:  Skin is warm, dry and intact.  Psychiatric: Mood and affect are normal.    ____________________________________________     RADIOLOGY  Ultrasound positive for adenomyomatosis but negative for acute abnormality.   EKG reviewed and interpreted by myself shows normal sinus rhythm at 76 bpm, narrow QRS, normal axis, normal intervals, no ST changes. Normal EKG. ____________________________________________   INITIAL IMPRESSION / ASSESSMENT AND PLAN / ED COURSE  Pertinent labs & imaging results that were available during my care of the patient were reviewed by me and considered in my medical decision making (see chart for details).  The patient presents to the emergency department for upper abdominal discomfort ongoing for greater than 3 weeks. Patient's labs are largely within normal limits including normal white blood cell count, largely normal LFTs. Normal lipase. We'll obtain a right upper quadrant ultrasound to further evaluate.  Patient agreeable with this plan.  Ultrasound shows adenomyomatosis however this is unlikely to be the cause of any of the patient's discomfort. Given the constant nagging type pain to the upper abdomen over 3 weeks I highly suspect gastritis or gastric ulcers. With an otherwise negative workup we will discharge home with Protonix, Maalox and GI follow-up for an endoscopy. Patient agreeable to plan.  ____________________________________________   FINAL CLINICAL IMPRESSION(S) / ED DIAGNOSES  Upper abdominal pain    Harvest Dark, MD 02/22/17 1657

## 2017-02-22 NOTE — ED Notes (Signed)
Patient transported to Ultrasound 

## 2017-05-03 IMAGING — DX DG KNEE 1-2V PORT*L*
2 series · 2 of 2 positions shown · non-contrast
Comparison: None.

CLINICAL DATA: Postop left total knee arthroplasty.

EXAM:
PORTABLE LEFT KNEE - 1-2 VIEW

[knee ap]
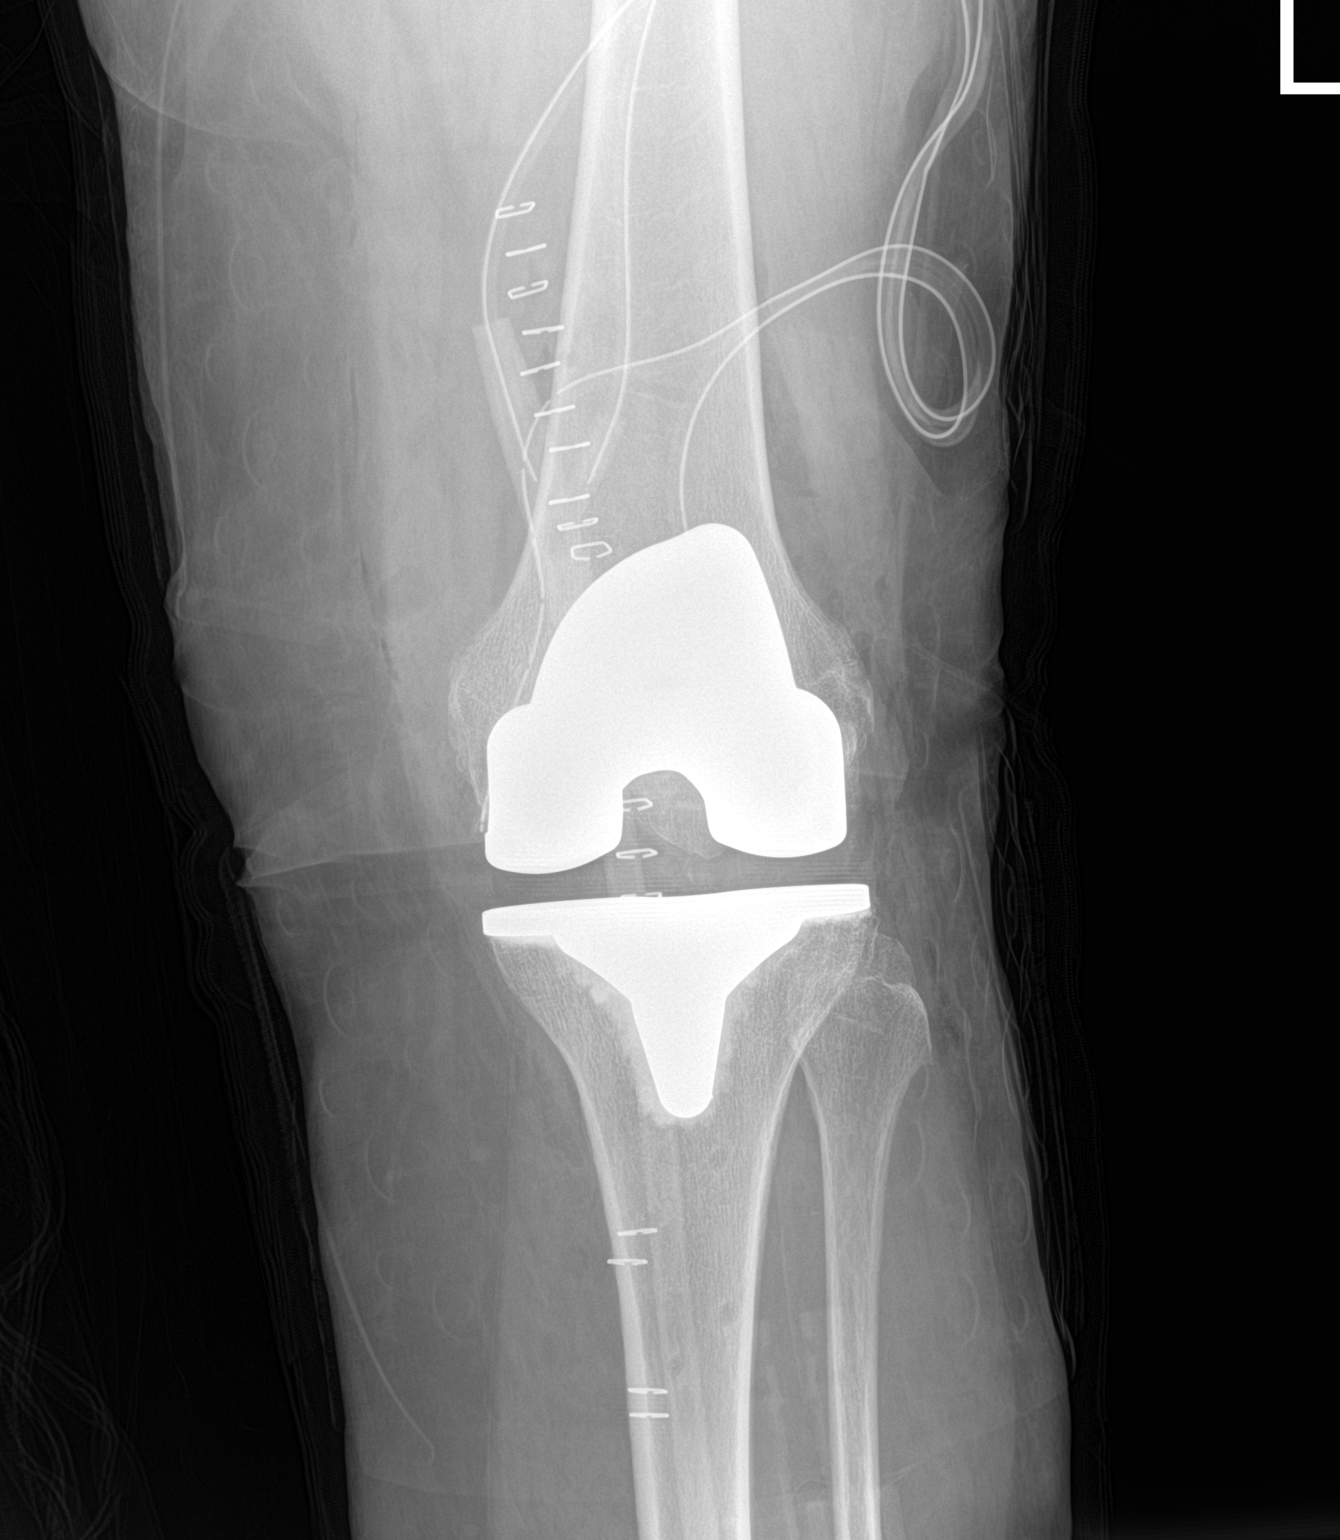

[knee lat]
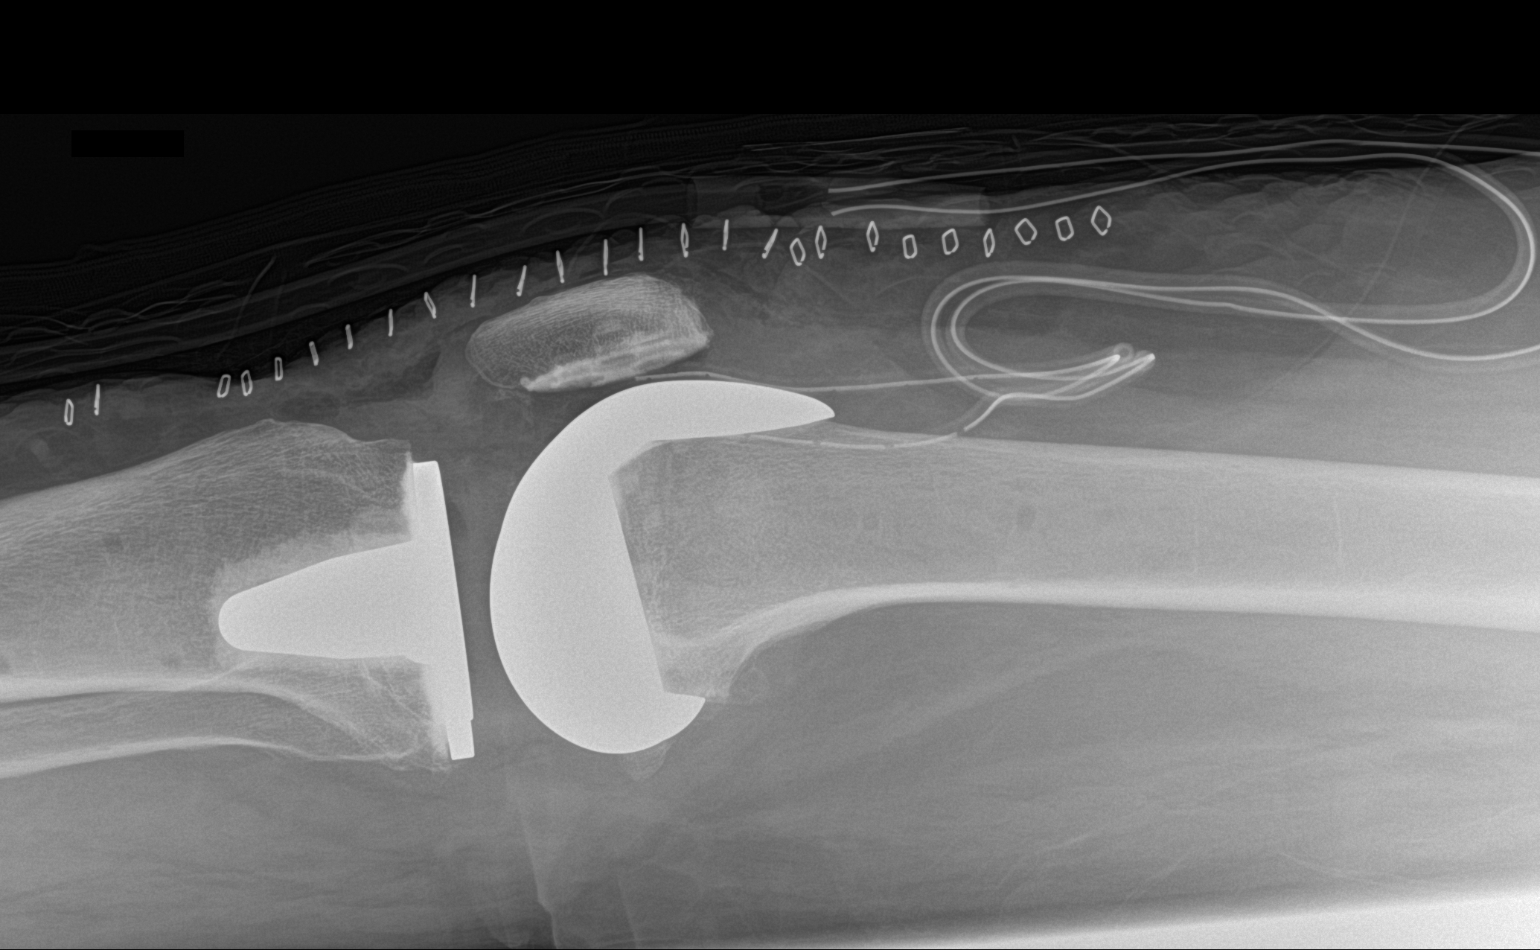

[2 of 2 positions shown; findings below may reference images not displayed]

FINDINGS: Status post left total knee arthroplasty. The hardware is well
positioned. No evidence of acute fracture or dislocation. There are
2 surgical drains in place. Anterior skin staples are noted. A small
amount of gas is present within the joint and surrounding soft
tissues.
IMPRESSION: No demonstrated complication following left total knee arthroplasty.

## 2017-05-10 DIAGNOSIS — K5909 Other constipation: Secondary | ICD-10-CM | POA: Insufficient documentation

## 2017-05-10 DIAGNOSIS — K760 Fatty (change of) liver, not elsewhere classified: Secondary | ICD-10-CM | POA: Insufficient documentation

## 2017-05-11 ENCOUNTER — Ambulatory Visit
Admission: RE | Admit: 2017-05-11 | Discharge: 2017-05-11 | Disposition: A | Discharge status: Home / Self Care | Payer: Medicare Other | Source: Ambulatory Visit | Attending: Unknown Physician Specialty | Admitting: Unknown Physician Specialty

## 2017-05-11 ENCOUNTER — Encounter: Payer: Self-pay | Admitting: *Deleted

## 2017-05-11 ENCOUNTER — Ambulatory Visit: Payer: Medicare Other | Admitting: Anesthesiology

## 2017-05-11 ENCOUNTER — Encounter
Admission: RE | Disposition: A | Discharge status: Home / Self Care | Payer: Self-pay | Source: Ambulatory Visit | Attending: Unknown Physician Specialty

## 2017-05-11 DIAGNOSIS — M1990 Unspecified osteoarthritis, unspecified site: Secondary | ICD-10-CM | POA: Insufficient documentation

## 2017-05-11 DIAGNOSIS — D122 Benign neoplasm of ascending colon: Secondary | ICD-10-CM | POA: Diagnosis not present

## 2017-05-11 DIAGNOSIS — K449 Diaphragmatic hernia without obstruction or gangrene: Secondary | ICD-10-CM | POA: Diagnosis not present

## 2017-05-11 DIAGNOSIS — K5900 Constipation, unspecified: Secondary | ICD-10-CM | POA: Diagnosis present

## 2017-05-11 DIAGNOSIS — Z880 Allergy status to penicillin: Secondary | ICD-10-CM | POA: Insufficient documentation

## 2017-05-11 DIAGNOSIS — K219 Gastro-esophageal reflux disease without esophagitis: Secondary | ICD-10-CM | POA: Diagnosis not present

## 2017-05-11 DIAGNOSIS — F419 Anxiety disorder, unspecified: Secondary | ICD-10-CM | POA: Diagnosis not present

## 2017-05-11 DIAGNOSIS — K644 Residual hemorrhoidal skin tags: Secondary | ICD-10-CM | POA: Insufficient documentation

## 2017-05-11 DIAGNOSIS — K641 Second degree hemorrhoids: Secondary | ICD-10-CM | POA: Diagnosis not present

## 2017-05-11 DIAGNOSIS — Z79899 Other long term (current) drug therapy: Secondary | ICD-10-CM | POA: Diagnosis not present

## 2017-05-11 DIAGNOSIS — K635 Polyp of colon: Secondary | ICD-10-CM | POA: Insufficient documentation

## 2017-05-11 DIAGNOSIS — K222 Esophageal obstruction: Secondary | ICD-10-CM | POA: Diagnosis not present

## 2017-05-11 DIAGNOSIS — Z7901 Long term (current) use of anticoagulants: Secondary | ICD-10-CM | POA: Diagnosis not present

## 2017-05-11 DIAGNOSIS — Z90710 Acquired absence of both cervix and uterus: Secondary | ICD-10-CM | POA: Diagnosis not present

## 2017-05-11 DIAGNOSIS — Z96652 Presence of left artificial knee joint: Secondary | ICD-10-CM | POA: Insufficient documentation

## 2017-05-11 DIAGNOSIS — K5730 Diverticulosis of large intestine without perforation or abscess without bleeding: Secondary | ICD-10-CM | POA: Diagnosis not present

## 2017-05-11 DIAGNOSIS — J45909 Unspecified asthma, uncomplicated: Secondary | ICD-10-CM | POA: Diagnosis not present

## 2017-05-11 HISTORY — PX: ESOPHAGOGASTRODUODENOSCOPY (EGD) WITH PROPOFOL: SHX5813

## 2017-05-11 HISTORY — PX: COLONOSCOPY WITH PROPOFOL: SHX5780

## 2017-05-11 SURGERY — ESOPHAGOGASTRODUODENOSCOPY (EGD) WITH PROPOFOL
Anesthesia: General

## 2017-05-11 MED ORDER — GENTAMICIN SULFATE 40 MG/ML IJ SOLN
100.0000 mg | Freq: Once | INTRAVENOUS | Status: AC
  Administered 2017-05-11: 100 mg via INTRAVENOUS
  Filled 2017-05-11: qty 2.5

## 2017-05-11 MED ORDER — SODIUM CHLORIDE 0.9 % IV SOLN
INTRAVENOUS | Status: DC
  Administered 2017-05-11: 1000 mL via INTRAVENOUS

## 2017-05-11 MED ORDER — PROPOFOL 500 MG/50ML IV EMUL
INTRAVENOUS | Status: DC | PRN
  Administered 2017-05-11: 100 ug/kg/min via INTRAVENOUS

## 2017-05-11 MED ORDER — PROPOFOL 10 MG/ML IV BOLUS
INTRAVENOUS | Status: DC | PRN
  Administered 2017-05-11: 50 mg via INTRAVENOUS
  Administered 2017-05-11 (×2): 20 mg via INTRAVENOUS
  Administered 2017-05-11: 50 mg via INTRAVENOUS

## 2017-05-11 MED ORDER — VANCOMYCIN HCL IN DEXTROSE 1-5 GM/200ML-% IV SOLN
INTRAVENOUS | Status: AC
  Administered 2017-05-11: 1 g
  Filled 2017-05-11: qty 200

## 2017-05-11 MED ORDER — LIDOCAINE HCL (CARDIAC) 20 MG/ML IV SOLN
INTRAVENOUS | Status: DC | PRN
  Administered 2017-05-11: 35 mg via INTRAVENOUS

## 2017-05-11 MED ORDER — SODIUM CHLORIDE 0.9 % IV SOLN: INTRAVENOUS | Status: DC

## 2017-05-11 MED ORDER — VANCOMYCIN HCL 1000 MG IV SOLR
1000.0000 mg | INTRAVENOUS | Status: DC
  Filled 2017-05-11: qty 1000

## 2017-05-11 NOTE — H&P (Signed)
Primary Care Physician:  System, Pcp Not In Primary Gastroenterologist:  Dr. Vira Agar  Pre-Procedure History & Physical: HPI:  Jo Malone is a 81 y.o. female is here for an endoscopy and colonoscopy.   Past Medical History:  Diagnosis Date  . Anxiety   . Arthritis   . Asthma   . GERD (gastroesophageal reflux disease)   . Neuropathy     Past Surgical History:  Procedure Laterality Date  . ABDOMINAL HYSTERECTOMY    . APPENDECTOMY    . BACK SURGERY     metal plate and screws  . BREAST SURGERY     implants  . CATARACT EXTRACTION, BILATERAL    . COLONOSCOPY    . DILATION AND CURETTAGE OF UTERUS    . FOOT SURGERY Bilateral   . HEMORRHOID SURGERY    . JOINT REPLACEMENT    . KNEE ARTHROPLASTY Left 12/17/2015   Procedure: COMPUTER ASSISTED TOTAL KNEE ARTHROPLASTY;  Surgeon: Dereck Leep, MD;  Location: [facility] ORS;  Service: Orthopedics;  Laterality: Left;  . TUBAL LIGATION    . VARICOSE VEIN SURGERY      Prior to Admission medications   Medication Sig Start Date End Date Taking? Authorizing Provider  albuterol (PROAIR HFA) 108 (90 Base) MCG/ACT inhaler Inhale 2 puffs into the lungs 2 (two) times daily as needed for wheezing or shortness of breath.   Yes [provider]  albuterol (PROVENTIL) (2.5 MG/3ML) 0.083% nebulizer solution Take 2.5 mg by nebulization every 6 (six) hours as needed for wheezing or shortness of breath.   Yes [provider]  ALPRAZolam (XANAX) 0.25 MG tablet Take 0.25 mg by mouth 3 (three) times daily as needed for anxiety.   Yes [provider]  budesonide-formoterol (SYMBICORT) 160-4.5 MCG/ACT inhaler Inhale 2 puffs into the lungs 2 (two) times daily as needed.   Yes [provider]  Cholecalciferol (VITAMIN D-3 PO) Take 5,000 Units by mouth daily.   Yes [provider]  Cyanocobalamin (VITAMIN B-12 CR PO) Take 1,000 mg by mouth daily.   Yes [provider]  gabapentin (NEURONTIN) 600 MG tablet Take  300 mg by mouth 2 (two) times daily. One half tablet after lunch and one half tablet at bedtime   Yes [provider]  meloxicam (MOBIC) 7.5 MG tablet Take 7.5 mg by mouth 2 (two) times daily as needed for pain. Reported on 12/17/2015   Yes [provider]  montelukast (SINGULAIR) 10 MG tablet Take 10 mg by mouth at bedtime.   Yes [provider]  pantoprazole (PROTONIX) 40 MG tablet Take 1 tablet (40 mg total) by mouth daily. 02/22/17 02/22/18 Yes Paduchowski, Lennette Bihari, MD  Potassium 99 MG TABS Take 1 tablet by mouth daily.   Yes [provider]  zolpidem (AMBIEN) 10 MG tablet Take 10 mg by mouth at bedtime as needed for sleep.   Yes [provider]  alum & mag hydroxide-simeth (MAALOX REGULAR STRENGTH) 322-025-42 MG/5ML suspension Take 15 mLs by mouth 3 (three) times daily. Patient not taking: Reported on 05/11/2017 02/22/17   Harvest Dark, MD  enoxaparin (LOVENOX) 40 MG/0.4ML injection Inject 0.4 mLs (40 mg total) into the skin daily. Patient not taking: Reported on 05/11/2017 12/18/15   Watt Climes, PA  furosemide (LASIX) 40 MG tablet Take 40 mg by mouth daily as needed.     [provider]  oxyCODONE (OXY IR/ROXICODONE) 5 MG immediate release tablet Take 1-2 tablets (5-10 mg total) by mouth every 4 (four)  hours as needed for severe pain or breakthrough pain. Patient not taking: Reported on 05/11/2017 12/18/15   Watt Climes, PA  traMADol (ULTRAM) 50 MG tablet Take 1-2 tablets (50-100 mg total) by mouth every 4 (four) hours as needed for moderate pain. 12/18/15   Watt Climes, PA    Allergies as of 05/10/2017 - Review Complete 02/22/2017  Allergen Reaction Noted  . Penicillins Hives 02/19/2011    History reviewed. No pertinent family history.  Social History   Social History  . Marital status: Widowed    Spouse name: N/A  . Number of children: N/A  . Years of education: N/A   Occupational History  . Not on file.   Social History Main  Topics  . Smoking status: Never Smoker  . Smokeless tobacco: Never Used  . Alcohol use No  . Drug use: No  . Sexual activity: Not on file   Other Topics Concern  . Not on file   Social History Narrative  . No narrative on file    Review of Systems: See HPI, otherwise negative ROS  Physical Exam: BP (!) 148/65   Pulse 72   Temp 97.8 F (36.6 C) (Tympanic)   Resp 16   Ht 5' (1.524 m)   Wt 88.5 kg (195 lb)   SpO2 96%   BMI 38.08 kg/m  General:   Alert,  pleasant and cooperative in NAD Head:  Normocephalic and atraumatic. Neck:  Supple; no masses or thyromegaly. Lungs:  Clear throughout to auscultation.    Heart:  Regular rate and rhythm. Abdomen:  Soft, nontender and nondistended. Normal bowel sounds, without guarding, and without rebound.   Neurologic:  Alert and  oriented x4;  grossly normal neurologically.  Impression/Plan: Jo Malone is here for an endoscopy and colonoscopy to be performed for abdominal pain and constipation.  Risks, benefits, limitations, and alternatives regarding  endoscopy and colonoscopy have been reviewed with the patient.  Questions have been answered.  All parties agreeable.   Gaylyn Cheers, MD  05/11/2017, 1:01 PM

## 2017-05-11 NOTE — Anesthesia Preprocedure Evaluation (Signed)
Anesthesia Evaluation  Patient identified by MRN, date of birth, ID band Patient awake    Reviewed: Allergy & Precautions, NPO status , Patient's Chart, lab work & pertinent test results  History of Anesthesia Complications Negative for: history of anesthetic complications  Airway Mallampati: III  TM Distance: >3 FB     Dental  (+) Caps   Pulmonary asthma ,    Pulmonary exam normal        Cardiovascular negative cardio ROS Normal cardiovascular exam     Neuro/Psych Anxiety  Neuromuscular disease negative neurological ROS     GI/Hepatic Neg liver ROS, GERD  ,  Endo/Other  negative endocrine ROS  Renal/GU negative Renal ROS     Musculoskeletal  (+) Arthritis , Osteoarthritis,    Abdominal Normal abdominal exam  (+)   Peds negative pediatric ROS (+)  Hematology negative hematology ROS (+)   Anesthesia Other Findings   Reproductive/Obstetrics                             Anesthesia Physical  Anesthesia Plan  ASA: II  Anesthesia Plan: General   Post-op Pain Management:    Induction: Intravenous  PONV Risk Score and Plan:   Airway Management Planned: Nasal Cannula  Additional Equipment:   Intra-op Plan:   Post-operative Plan:   Informed Consent: I have reviewed the patients History and Physical, chart, labs and discussed the procedure including the risks, benefits and alternatives for the proposed anesthesia with the patient or authorized representative who has indicated his/her understanding and acceptance.   Dental advisory given  Plan Discussed with: CRNA and Surgeon  Anesthesia Plan Comments:         Anesthesia Quick Evaluation

## 2017-05-11 NOTE — Transfer of Care (Signed)
Immediate Anesthesia Transfer of Care Note  Patient: Jo Malone  Procedure(s) Performed: Procedure(s): ESOPHAGOGASTRODUODENOSCOPY (EGD) WITH PROPOFOL (N/A) COLONOSCOPY WITH PROPOFOL (N/A)  Patient Location: PACU and Endoscopy Unit  Anesthesia Type:General  Level of Consciousness: drowsy and patient cooperative  Airway & Oxygen Therapy: Patient Spontanous Breathing and Patient connected to nasal cannula oxygen  Post-op Assessment: Report given to RN and Post -op Vital signs reviewed and stable  Post vital signs: Reviewed and stable  Last Vitals:  Vitals:   05/11/17 1123 05/11/17 1358  BP: (!) 148/65 (!) 115/53  Pulse: 72 72  Resp: 16 17  Temp: 36.6 C (!) 36.2 C  SpO2: 96% 99%    Last Pain:  Vitals:   05/11/17 1358  TempSrc: Tympanic         Complications: No aneshetic complications

## 2017-05-11 NOTE — Op Note (Signed)
[facility] Gastroenterology Patient Name: Jo Malone Procedure Date: 05/11/2017 1:08 PM MRN: [id number] Account #: [id number] Date of Birth: Jan 30, 1936 Admit Type: Outpatient Age: 81 Room: [facility] ENDO ROOM 3 Gender: Female Note Status: Finalized Procedure:            Upper GI endoscopy Indications:          Dysphagia, Heartburn Providers:            Manya Silvas, MD Medicines:            Propofol per Anesthesia Complications:        No immediate complications. Procedure:            Pre-Anesthesia Assessment:                       - After reviewing the risks and benefits, the patient                        was deemed in satisfactory condition to undergo the                        procedure.                       After obtaining informed consent, the endoscope was                        passed under direct vision. Throughout the procedure,                        the patient's blood pressure, pulse, and oxygen                        saturations were monitored continuously. The Endoscope                        was introduced through the mouth, and advanced to the                        second part of duodenum. The upper GI endoscopy was                        accomplished without difficulty. The patient tolerated                        the procedure well. Findings:      Esophagus normal until GEJ.      A mild Schatzki ring (acquired) was found at the gastroesophageal       junction. At the end of the procedure A guidewire was placed and the       scope was withdrawn. Dilation was performed with a Savary dilator with       mild resistance at 16 mm. No gastric abnormality.      A small hiatal hernia was present.      The examined duodenum was normal. Impression:           - Mild Schatzki ring. Dilated.                       - Small hiatal hernia.                       -  Normal examined duodenum.                       - No specimens collected. Recommendation:        - Perform a colonoscopy as previously scheduled. Manya Silvas, MD 05/11/2017 1:23:56 PM This report has been signed electronically. Number of Addenda: 0 Note Initiated On: 05/11/2017 1:08 PM      [facility]

## 2017-05-11 NOTE — Anesthesia Post-op Follow-up Note (Signed)
Anesthesia QCDR form completed.        

## 2017-05-11 NOTE — Op Note (Signed)
[facility] Gastroenterology Patient Name: Jo Malone Procedure Date: 05/11/2017 1:07 PM MRN: [id number] Account #: [id number] Date of Birth: [birth year] Admit Type: Outpatient Age: 81 Room: [facility] ENDO ROOM 3 Gender: Female Note Status: Finalized Procedure:            Colonoscopy Indications:          Constipation Providers:            Manya Silvas, MD Medicines:            Propofol per Anesthesia Complications:        No immediate complications. Procedure:            Pre-Anesthesia Assessment:                       - After reviewing the risks and benefits, the patient                        was deemed in satisfactory condition to undergo the                        procedure.                       After obtaining informed consent, the colonoscope was                        passed under direct vision. Throughout the procedure,                        the patient's blood pressure, pulse, and oxygen                        saturations were monitored continuously. The                        Colonoscope was introduced through the anus and                        advanced to the the cecum, identified by appendiceal                        orifice and ileocecal valve. The colonoscopy was                        performed without difficulty. The patient tolerated the                        procedure well. The quality of the bowel preparation                        was excellent. Findings:      A diminutive polyp was found in the proximal sigmoid colon. The polyp       was sessile. The polyp was treated with a snare and destroyed but was       tiny and not removed.      Multiple small-mouthed diverticula were found in the sigmoid colon.      Internal hemorrhoids were found during endoscopy. The hemorrhoids were       small, medium-sized, Grade I (internal hemorrhoids that do not prolapse)       and Grade II (internal hemorrhoids  that prolapse but reduce   spontaneously). Also had external hemorrhoids.      A diminutive polyp was found in the ascending colon. The polyp was       sessile. The polyp was removed with a jumbo cold forceps. Resection and       retrieval were complete. Impression:           - One diminutive polyp in the proximal sigmoid colon,                        removed with a jumbo cold forceps. Resected and                        retrieved.                       - Diverticulosis in the sigmoid colon.                       - Internal hemorrhoids. Recommendation:       - The findings and recommendations were discussed with                        the patient's family. Manya Silvas, MD 05/11/2017 1:57:11 PM This report has been signed electronically. Number of Addenda: 0 Note Initiated On: 05/11/2017 1:07 PM Scope Withdrawal Time: 0 hours 20 minutes 5 seconds  Total Procedure Duration: 0 hours 25 minutes 40 seconds       [facility]

## 2017-05-11 NOTE — Anesthesia Postprocedure Evaluation (Signed)
Anesthesia Post Note  Patient: GRAYSON WHITE  Procedure(s) Performed: Procedure(s) (LRB): ESOPHAGOGASTRODUODENOSCOPY (EGD) WITH PROPOFOL (N/A) COLONOSCOPY WITH PROPOFOL (N/A)  Patient location during evaluation: Endoscopy Anesthesia Type: General Level of consciousness: awake and alert Pain management: pain level controlled Vital Signs Assessment: post-procedure vital signs reviewed and stable Respiratory status: spontaneous breathing and respiratory function stable Cardiovascular status: stable Anesthetic complications: no     Last Vitals:  Vitals:   05/11/17 1420 05/11/17 1427  BP: 123/72 117/73  Pulse: 66 67  Resp: 16 16  Temp:    SpO2: 98% 99%    Last Pain:  Vitals:   05/11/17 1358  TempSrc: Tympanic                 KEPHART,WILLIAM K

## 2017-05-12 ENCOUNTER — Encounter: Payer: Self-pay | Admitting: Unknown Physician Specialty

## 2017-05-13 LAB — SURGICAL PATHOLOGY

## 2017-08-04 ENCOUNTER — Encounter
Admission: RE | Admit: 2017-08-04 | Discharge: 2017-08-04 | Disposition: A | Discharge status: Home / Self Care | Payer: Medicare Other | Source: Ambulatory Visit | Attending: Orthopedic Surgery | Admitting: Orthopedic Surgery

## 2017-08-04 ENCOUNTER — Other Ambulatory Visit: Payer: Self-pay

## 2017-08-04 DIAGNOSIS — M25561 Pain in right knee: Secondary | ICD-10-CM | POA: Insufficient documentation

## 2017-08-04 DIAGNOSIS — M1711 Unilateral primary osteoarthritis, right knee: Secondary | ICD-10-CM | POA: Diagnosis not present

## 2017-08-04 DIAGNOSIS — Z7952 Long term (current) use of systemic steroids: Secondary | ICD-10-CM | POA: Insufficient documentation

## 2017-08-04 DIAGNOSIS — Z9889 Other specified postprocedural states: Secondary | ICD-10-CM | POA: Insufficient documentation

## 2017-08-04 DIAGNOSIS — Z90710 Acquired absence of both cervix and uterus: Secondary | ICD-10-CM | POA: Insufficient documentation

## 2017-08-04 DIAGNOSIS — Z8619 Personal history of other infectious and parasitic diseases: Secondary | ICD-10-CM | POA: Insufficient documentation

## 2017-08-04 DIAGNOSIS — Z8249 Family history of ischemic heart disease and other diseases of the circulatory system: Secondary | ICD-10-CM | POA: Insufficient documentation

## 2017-08-04 DIAGNOSIS — J45909 Unspecified asthma, uncomplicated: Secondary | ICD-10-CM | POA: Diagnosis not present

## 2017-08-04 DIAGNOSIS — Z885 Allergy status to narcotic agent status: Secondary | ICD-10-CM | POA: Insufficient documentation

## 2017-08-04 DIAGNOSIS — Z880 Allergy status to penicillin: Secondary | ICD-10-CM | POA: Diagnosis not present

## 2017-08-04 DIAGNOSIS — Z79899 Other long term (current) drug therapy: Secondary | ICD-10-CM | POA: Diagnosis not present

## 2017-08-04 DIAGNOSIS — Z01812 Encounter for preprocedural laboratory examination: Secondary | ICD-10-CM | POA: Diagnosis present

## 2017-08-04 DIAGNOSIS — Z791 Long term (current) use of non-steroidal anti-inflammatories (NSAID): Secondary | ICD-10-CM | POA: Insufficient documentation

## 2017-08-04 DIAGNOSIS — K760 Fatty (change of) liver, not elsewhere classified: Secondary | ICD-10-CM | POA: Insufficient documentation

## 2017-08-04 HISTORY — DX: Personal history of other diseases of the digestive system: Z87.19

## 2017-08-04 LAB — URINALYSIS, ROUTINE W REFLEX MICROSCOPIC
BILIRUBIN URINE: NEGATIVE
Bacteria, UA: NONE SEEN
Glucose, UA: NEGATIVE mg/dL
HGB URINE DIPSTICK: NEGATIVE
Ketones, ur: NEGATIVE mg/dL
Leukocytes, UA: NEGATIVE
Nitrite: NEGATIVE
PROTEIN: 30 mg/dL — AB
SPECIFIC GRAVITY, URINE: 1.026 (ref 1.005–1.030)
pH: 5 (ref 5.0–8.0)

## 2017-08-04 LAB — TYPE AND SCREEN
ABO/RH(D): O POS
ANTIBODY SCREEN: NEGATIVE

## 2017-08-04 LAB — PROTIME-INR
INR: 0.95
Prothrombin Time: 12.6 seconds (ref 11.4–15.2)

## 2017-08-04 LAB — SEDIMENTATION RATE: SED RATE: 50 mm/h — AB (ref 0–30)

## 2017-08-04 LAB — SURGICAL PCR SCREEN
MRSA, PCR: POSITIVE — AB
Staphylococcus aureus: POSITIVE — AB

## 2017-08-04 LAB — C-REACTIVE PROTEIN: CRP: 1.2 mg/dL — AB (ref <1.0)

## 2017-08-04 LAB — APTT: aPTT: 27 seconds (ref 24–36)

## 2017-08-04 NOTE — Pre-Procedure Instructions (Signed)
Michelle from the lab called to notify pre admit testing of a positive MRSA swab.

## 2017-08-04 NOTE — Pre-Procedure Instructions (Signed)
Pt brought a copy of CBC, Met B and EKG from her PCP done 07/26/2017, copies faxed to Dr. Clydell Hakim office, not drawn at PAT visit.

## 2017-08-04 NOTE — Patient Instructions (Signed)
  Your procedure is scheduled on:Wednesday Dec. 12 , 2018. Report to Same Day Surgery. To find out your arrival time please call [phone] between 1PM - 3PM on Tuesday Dec. 11, 2018 .  Remember: Instructions that are not followed completely may result in serious medical risk, up to and including death, or upon the discretion of your surgeon and anesthesiologist your surgery may need to be rescheduled.    _x___ 1. Do not eat food after midnight night prior to surgery. No gum   chewing or hard candies, snacks or breakfast.    02/03/2023 drink the following: water, Gatorade, clear apple juice, black coffee     or black tea up until 2 hours prior to ARRIVAL time.     ____ 2. No Alcohol for 24 hours before or after surgery.   ____ 3. Bring all medications with you on the day of surgery if instructed.    __x__ 4. Notify your doctor if there is any change in your medical condition     (cold, fever, infections).    _____ 5.   Do Not Smoke or use e-cigarettes For 24 Hours Prior to Your   Surgery.  Do not use any chewable tobacco products for at least 6   hours prior to  surgery.                      Do not wear jewelry, make-up, hairpins, clips or nail polish.  Do not wear lotions, powders, or perfumes.   Do not shave 48 hours prior to surgery. Men may shave face and neck.  Do not bring valuables to the hospital.    [facility] is not responsible for any belongings or valuables.               Contacts, dentures or bridgework may not be worn into surgery.  Leave your suitcase in the car. After surgery it may be brought to your room.  For patients admitted to the hospital, discharge time is determined by your  treatment team.   Patients discharged the day of surgery will not be allowed to drive home.    Please read over the following fact sheets that you were given:   [facility] Preparing for Surgery  _x___ Take these medicines the morning of surgery with A SIP OF WATER:    1. pantoprazole  (PROTONIX)take the morningof surgery and an extra dose at bedtime the night prior to   surgery.  2. predniSONE (DELTASONE)  3. sertraline (ZOLOFT)      ____ Fleet Enema (as directed)   _x___ Use CHG Soap as directed on instruction sheet  _x___ Use inhalers on the day of surgery and bring to hospital day of surgery  ____ Stop metformin 2 days prior to surgery    ____ Take 1/2 of usual insulin dose the night before surgery and none on the morning of          surgery.   ____ Stop Eliquis/Coumadin/Plavix/aspirin on does not apply.  __x__ Stop Anti-inflammatories such as Advil, Aleve, Ibuprofen, Motrin, Naproxen,  Naprosyn, Goodies powders or aspirin products. OK to take Tylenol.   ____ Stop supplements until after surgery.    ____ Bring C-Pap to the hospital.

## 2017-08-05 LAB — URINE CULTURE
CULTURE: NO GROWTH
Special Requests: NORMAL

## 2017-08-05 NOTE — Pre-Procedure Instructions (Signed)
CRP results sent to Dr. Hooten for review. 

## 2017-08-16 MED ORDER — TRANEXAMIC ACID 1000 MG/10ML IV SOLN
1000.0000 mg | INTRAVENOUS | Status: DC
  Filled 2017-08-16: qty 10

## 2017-08-16 MED ORDER — VANCOMYCIN HCL IN DEXTROSE 1-5 GM/200ML-% IV SOLN
1000.0000 mg | Freq: Once | INTRAVENOUS | Status: AC
  Administered 2017-08-17: 1000 mg via INTRAVENOUS

## 2017-08-16 MED ORDER — CLINDAMYCIN PHOSPHATE 900 MG/50ML IV SOLN: 900.0000 mg | INTRAVENOUS | Status: DC

## 2017-08-17 ENCOUNTER — Encounter
Admission: RE | Disposition: A | Discharge status: Home / Self Care | Payer: Self-pay | Source: Ambulatory Visit | Attending: Orthopedic Surgery

## 2017-08-17 ENCOUNTER — Inpatient Hospital Stay: Payer: Medicare Other | Admitting: Anesthesiology

## 2017-08-17 ENCOUNTER — Inpatient Hospital Stay: Payer: Medicare Other

## 2017-08-17 ENCOUNTER — Observation Stay
Admission: RE | Admit: 2017-08-17 | Discharge: 2017-08-18 | Disposition: A | Discharge status: Home / Self Care | Payer: Medicare Other | Source: Ambulatory Visit | Attending: Internal Medicine | Admitting: Internal Medicine

## 2017-08-17 ENCOUNTER — Other Ambulatory Visit: Payer: Self-pay

## 2017-08-17 ENCOUNTER — Encounter: Payer: Self-pay | Admitting: Orthopedic Surgery

## 2017-08-17 DIAGNOSIS — Z880 Allergy status to penicillin: Secondary | ICD-10-CM | POA: Diagnosis not present

## 2017-08-17 DIAGNOSIS — Z96659 Presence of unspecified artificial knee joint: Secondary | ICD-10-CM

## 2017-08-17 DIAGNOSIS — M19019 Primary osteoarthritis, unspecified shoulder: Secondary | ICD-10-CM | POA: Insufficient documentation

## 2017-08-17 DIAGNOSIS — Z79899 Other long term (current) drug therapy: Secondary | ICD-10-CM | POA: Insufficient documentation

## 2017-08-17 DIAGNOSIS — Z90710 Acquired absence of both cervix and uterus: Secondary | ICD-10-CM | POA: Diagnosis not present

## 2017-08-17 DIAGNOSIS — J4521 Mild intermittent asthma with (acute) exacerbation: Secondary | ICD-10-CM

## 2017-08-17 DIAGNOSIS — Z885 Allergy status to narcotic agent status: Secondary | ICD-10-CM | POA: Diagnosis not present

## 2017-08-17 DIAGNOSIS — Z8614 Personal history of Methicillin resistant Staphylococcus aureus infection: Secondary | ICD-10-CM | POA: Insufficient documentation

## 2017-08-17 DIAGNOSIS — G609 Hereditary and idiopathic neuropathy, unspecified: Secondary | ICD-10-CM | POA: Insufficient documentation

## 2017-08-17 DIAGNOSIS — F329 Major depressive disorder, single episode, unspecified: Secondary | ICD-10-CM | POA: Diagnosis not present

## 2017-08-17 DIAGNOSIS — Z5309 Procedure and treatment not carried out because of other contraindication: Secondary | ICD-10-CM | POA: Diagnosis not present

## 2017-08-17 DIAGNOSIS — T782XXA Anaphylactic shock, unspecified, initial encounter: Secondary | ICD-10-CM | POA: Diagnosis present

## 2017-08-17 DIAGNOSIS — J45909 Unspecified asthma, uncomplicated: Secondary | ICD-10-CM | POA: Insufficient documentation

## 2017-08-17 DIAGNOSIS — M1711 Unilateral primary osteoarthritis, right knee: Secondary | ICD-10-CM | POA: Diagnosis present

## 2017-08-17 DIAGNOSIS — K219 Gastro-esophageal reflux disease without esophagitis: Secondary | ICD-10-CM | POA: Diagnosis not present

## 2017-08-17 DIAGNOSIS — T368X5A Adverse effect of other systemic antibiotics, initial encounter: Secondary | ICD-10-CM | POA: Insufficient documentation

## 2017-08-17 DIAGNOSIS — Y92234 Operating room of hospital as the place of occurrence of the external cause: Secondary | ICD-10-CM | POA: Insufficient documentation

## 2017-08-17 DIAGNOSIS — F419 Anxiety disorder, unspecified: Secondary | ICD-10-CM | POA: Diagnosis not present

## 2017-08-17 SURGERY — ARTHROPLASTY, KNEE, TOTAL, USING IMAGELESS COMPUTER-ASSISTED NAVIGATION
Anesthesia: Spinal | Laterality: Right

## 2017-08-17 MED ORDER — DEXAMETHASONE SODIUM PHOSPHATE 10 MG/ML IJ SOLN
INTRAMUSCULAR | Status: AC
  Administered 2017-08-17: 10 mg via INTRAVENOUS
  Filled 2017-08-17: qty 1

## 2017-08-17 MED ORDER — ENOXAPARIN SODIUM 30 MG/0.3ML ~~LOC~~ SOLN: 30.0000 mg | Freq: Two times a day (BID) | SUBCUTANEOUS | Status: DC

## 2017-08-17 MED ORDER — SENNOSIDES-DOCUSATE SODIUM 8.6-50 MG PO TABS: 1.0000 | ORAL_TABLET | Freq: Two times a day (BID) | ORAL | Status: DC

## 2017-08-17 MED ORDER — ALUM & MAG HYDROXIDE-SIMETH 200-200-20 MG/5ML PO SUSP: 30.0000 mL | ORAL | Status: DC | PRN

## 2017-08-17 MED ORDER — CLINDAMYCIN PHOSPHATE 900 MG/50ML IV SOLN
INTRAVENOUS | Status: AC
  Filled 2017-08-17: qty 50

## 2017-08-17 MED ORDER — METHYLPREDNISOLONE SODIUM SUCC 125 MG IJ SOLR
125.0000 mg | Freq: Once | INTRAMUSCULAR | Status: AC
  Administered 2017-08-17: 125 mg via INTRAVENOUS

## 2017-08-17 MED ORDER — DIPHENHYDRAMINE HCL 50 MG/ML IJ SOLN
INTRAMUSCULAR | Status: AC
  Filled 2017-08-17: qty 1

## 2017-08-17 MED ORDER — DIPHENHYDRAMINE HCL 25 MG PO CAPS: 25.0000 mg | ORAL_CAPSULE | Freq: Four times a day (QID) | ORAL | Status: DC | PRN

## 2017-08-17 MED ORDER — ONDANSETRON HCL 4 MG/2ML IJ SOLN: 4.0000 mg | Freq: Four times a day (QID) | INTRAMUSCULAR | Status: DC | PRN

## 2017-08-17 MED ORDER — ALPRAZOLAM 0.25 MG PO TABS
0.2500 mg | ORAL_TABLET | Freq: Two times a day (BID) | ORAL | Status: DC | PRN
Start: 2017-08-17 — End: 2017-08-18
  Administered 2017-08-18: 0.25 mg via ORAL
  Filled 2017-08-17: qty 1

## 2017-08-17 MED ORDER — ACETAMINOPHEN 325 MG PO TABS: 650.0000 mg | ORAL_TABLET | Freq: Four times a day (QID) | ORAL | Status: DC | PRN

## 2017-08-17 MED ORDER — TRANEXAMIC ACID 1000 MG/10ML IV SOLN
1000.0000 mg | Freq: Once | INTRAVENOUS | Status: DC
  Filled 2017-08-17: qty 10

## 2017-08-17 MED ORDER — PANTOPRAZOLE SODIUM 40 MG PO TBEC
40.0000 mg | DELAYED_RELEASE_TABLET | Freq: Every day | ORAL | Status: DC
  Administered 2017-08-18: 40 mg via ORAL
  Filled 2017-08-17: qty 1

## 2017-08-17 MED ORDER — MAGNESIUM HYDROXIDE 400 MG/5ML PO SUSP: 30.0000 mL | Freq: Every day | ORAL | Status: DC | PRN

## 2017-08-17 MED ORDER — IPRATROPIUM-ALBUTEROL 0.5-2.5 (3) MG/3ML IN SOLN
3.0000 mL | Freq: Four times a day (QID) | RESPIRATORY_TRACT | Status: DC
  Administered 2017-08-17 – 2017-08-18 (×4): 3 mL via RESPIRATORY_TRACT
  Filled 2017-08-17 (×3): qty 3

## 2017-08-17 MED ORDER — SENNOSIDES-DOCUSATE SODIUM 8.6-50 MG PO TABS: 1.0000 | ORAL_TABLET | Freq: Every evening | ORAL | Status: DC | PRN

## 2017-08-17 MED ORDER — MENTHOL 3 MG MT LOZG
1.0000 | LOZENGE | OROMUCOSAL | Status: DC | PRN
  Filled 2017-08-17: qty 9

## 2017-08-17 MED ORDER — SODIUM CHLORIDE 0.9 % IV SOLN
INTRAVENOUS | Status: DC
  Administered 2017-08-17: 17:00:00 via INTRAVENOUS

## 2017-08-17 MED ORDER — SERTRALINE HCL 50 MG PO TABS
50.0000 mg | ORAL_TABLET | Freq: Every day | ORAL | Status: DC
  Administered 2017-08-18: 50 mg via ORAL
  Filled 2017-08-17: qty 1

## 2017-08-17 MED ORDER — METOCLOPRAMIDE HCL 10 MG PO TABS: 10.0000 mg | ORAL_TABLET | Freq: Three times a day (TID) | ORAL | Status: DC

## 2017-08-17 MED ORDER — DIPHENHYDRAMINE HCL 50 MG/ML IJ SOLN
25.0000 mg | Freq: Once | INTRAMUSCULAR | Status: AC
  Administered 2017-08-17: 25 mg via INTRAVENOUS

## 2017-08-17 MED ORDER — SODIUM CHLORIDE FLUSH 0.9 % IV SOLN
INTRAVENOUS | Status: AC
  Administered 2017-08-17: 12:00:00
  Filled 2017-08-17: qty 10

## 2017-08-17 MED ORDER — IPRATROPIUM-ALBUTEROL 0.5-2.5 (3) MG/3ML IN SOLN
RESPIRATORY_TRACT | Status: AC
  Administered 2017-08-17: 3 mL via RESPIRATORY_TRACT
  Filled 2017-08-17: qty 3

## 2017-08-17 MED ORDER — MONTELUKAST SODIUM 10 MG PO TABS
10.0000 mg | ORAL_TABLET | Freq: Every day | ORAL | Status: DC
  Administered 2017-08-17: 10 mg via ORAL
  Filled 2017-08-17: qty 1

## 2017-08-17 MED ORDER — DEXAMETHASONE SODIUM PHOSPHATE 10 MG/ML IJ SOLN
10.0000 mg | Freq: Once | INTRAMUSCULAR | Status: AC
  Administered 2017-08-17: 10 mg via INTRAVENOUS

## 2017-08-17 MED ORDER — RISAQUAD PO CAPS
1.0000 | ORAL_CAPSULE | Freq: Every day | ORAL | Status: DC
  Filled 2017-08-17: qty 1

## 2017-08-17 MED ORDER — ACETAMINOPHEN 325 MG PO TABS: 650.0000 mg | ORAL_TABLET | ORAL | Status: DC | PRN

## 2017-08-17 MED ORDER — ONDANSETRON HCL 4 MG PO TABS: 4.0000 mg | ORAL_TABLET | Freq: Four times a day (QID) | ORAL | Status: DC | PRN

## 2017-08-17 MED ORDER — GABAPENTIN 300 MG PO CAPS: 300.0000 mg | ORAL_CAPSULE | Freq: Two times a day (BID) | ORAL | Status: DC

## 2017-08-17 MED ORDER — GABAPENTIN 300 MG PO CAPS
300.0000 mg | ORAL_CAPSULE | Freq: Two times a day (BID) | ORAL | Status: DC
  Administered 2017-08-17: 300 mg via ORAL
  Filled 2017-08-17: qty 1

## 2017-08-17 MED ORDER — LACTATED RINGERS IV SOLN
INTRAVENOUS | Status: DC
  Administered 2017-08-17: 11:00:00 via INTRAVENOUS

## 2017-08-17 MED ORDER — POTASSIUM 99 MG PO TABS: 99.0000 mg | ORAL_TABLET | Freq: Every day | ORAL | Status: DC

## 2017-08-17 MED ORDER — METHYLPREDNISOLONE SODIUM SUCC 125 MG IJ SOLR
INTRAMUSCULAR | Status: AC
  Administered 2017-08-17: 125 mg via INTRAVENOUS
  Filled 2017-08-17: qty 2

## 2017-08-17 MED ORDER — VITAMIN D 1000 UNITS PO TABS: 1000.0000 [IU] | ORAL_TABLET | Freq: Every day | ORAL | Status: DC

## 2017-08-17 MED ORDER — FLEET ENEMA 7-19 GM/118ML RE ENEM: 1.0000 | ENEMA | Freq: Once | RECTAL | Status: DC | PRN

## 2017-08-17 MED ORDER — DIPHENHYDRAMINE HCL 12.5 MG/5ML PO ELIX: 12.5000 mg | ORAL_SOLUTION | ORAL | Status: DC | PRN

## 2017-08-17 MED ORDER — ALBUTEROL SULFATE (2.5 MG/3ML) 0.083% IN NEBU: 2.5000 mg | INHALATION_SOLUTION | RESPIRATORY_TRACT | Status: DC | PRN

## 2017-08-17 MED ORDER — OXYCODONE HCL 5 MG PO TABS: 5.0000 mg | ORAL_TABLET | ORAL | Status: DC | PRN

## 2017-08-17 MED ORDER — ALPRAZOLAM 0.25 MG PO TABS: 0.2500 mg | ORAL_TABLET | Freq: Two times a day (BID) | ORAL | Status: DC | PRN

## 2017-08-17 MED ORDER — MONTELUKAST SODIUM 10 MG PO TABS: 10.0000 mg | ORAL_TABLET | Freq: Every day | ORAL | Status: DC

## 2017-08-17 MED ORDER — ACETAMINOPHEN 650 MG RE SUPP: 650.0000 mg | RECTAL | Status: DC | PRN

## 2017-08-17 MED ORDER — OXYCODONE HCL 5 MG PO TABS: 10.0000 mg | ORAL_TABLET | ORAL | Status: DC | PRN

## 2017-08-17 MED ORDER — VITAMIN D 1000 UNITS PO TABS: 5000.0000 [IU] | ORAL_TABLET | Freq: Every day | ORAL | Status: DC

## 2017-08-17 MED ORDER — FUROSEMIDE 40 MG PO TABS: 40.0000 mg | ORAL_TABLET | Freq: Every day | ORAL | Status: DC | PRN

## 2017-08-17 MED ORDER — ZOLPIDEM TARTRATE 5 MG PO TABS: 5.0000 mg | ORAL_TABLET | Freq: Every evening | ORAL | Status: DC | PRN

## 2017-08-17 MED ORDER — SODIUM CHLORIDE 0.9% FLUSH: 3.0000 mL | INTRAVENOUS | Status: DC | PRN

## 2017-08-17 MED ORDER — IPRATROPIUM-ALBUTEROL 0.5-2.5 (3) MG/3ML IN SOLN: 3.0000 mL | Freq: Once | RESPIRATORY_TRACT | Status: AC

## 2017-08-17 MED ORDER — ALBUTEROL SULFATE (2.5 MG/3ML) 0.083% IN NEBU: 2.5000 mg | INHALATION_SOLUTION | Freq: Four times a day (QID) | RESPIRATORY_TRACT | Status: DC | PRN

## 2017-08-17 MED ORDER — PANTOPRAZOLE SODIUM 40 MG PO TBEC: 40.0000 mg | DELAYED_RELEASE_TABLET | Freq: Two times a day (BID) | ORAL | Status: DC

## 2017-08-17 MED ORDER — ACETAMINOPHEN 10 MG/ML IV SOLN
1000.0000 mg | Freq: Four times a day (QID) | INTRAVENOUS | Status: DC
  Filled 2017-08-17 (×4): qty 100

## 2017-08-17 MED ORDER — ALBUTEROL SULFATE HFA 108 (90 BASE) MCG/ACT IN AERS: 2.0000 | INHALATION_SPRAY | Freq: Two times a day (BID) | RESPIRATORY_TRACT | Status: DC | PRN

## 2017-08-17 MED ORDER — BISACODYL 10 MG RE SUPP: 10.0000 mg | Freq: Every day | RECTAL | Status: DC | PRN

## 2017-08-17 MED ORDER — SODIUM CHLORIDE 0.9% FLUSH: 3.0000 mL | Freq: Two times a day (BID) | INTRAVENOUS | Status: DC

## 2017-08-17 MED ORDER — CLINDAMYCIN PHOSPHATE 600 MG/50ML IV SOLN
600.0000 mg | Freq: Four times a day (QID) | INTRAVENOUS | Status: DC
  Filled 2017-08-17 (×4): qty 50

## 2017-08-17 MED ORDER — VITAMIN B-12 1000 MCG PO TABS: 1000.0000 ug | ORAL_TABLET | Freq: Every day | ORAL | Status: DC

## 2017-08-17 MED ORDER — DIPHENHYDRAMINE HCL 50 MG/ML IJ SOLN: 50.0000 mg | Freq: Once | INTRAMUSCULAR | Status: AC

## 2017-08-17 MED ORDER — ZOLPIDEM TARTRATE 5 MG PO TABS: 5.0000 mg | ORAL_TABLET | Freq: Every day | ORAL | Status: DC

## 2017-08-17 MED ORDER — MORPHINE SULFATE (PF) 2 MG/ML IV SOLN: 2.0000 mg | INTRAVENOUS | Status: DC | PRN

## 2017-08-17 MED ORDER — SODIUM CHLORIDE 0.9 % IV SOLN: 250.0000 mL | INTRAVENOUS | Status: DC | PRN

## 2017-08-17 MED ORDER — MOMETASONE FURO-FORMOTEROL FUM 200-5 MCG/ACT IN AERO
2.0000 | INHALATION_SPRAY | Freq: Two times a day (BID) | RESPIRATORY_TRACT | Status: DC
  Filled 2017-08-17: qty 8.8

## 2017-08-17 MED ORDER — FERROUS SULFATE 325 (65 FE) MG PO TABS: 325.0000 mg | ORAL_TABLET | Freq: Two times a day (BID) | ORAL | Status: DC

## 2017-08-17 MED ORDER — SERTRALINE HCL 50 MG PO TABS: 50.0000 mg | ORAL_TABLET | Freq: Every day | ORAL | Status: DC

## 2017-08-17 MED ORDER — CHLORHEXIDINE GLUCONATE 4 % EX LIQD: 60.0000 mL | Freq: Once | CUTANEOUS | Status: DC

## 2017-08-17 MED ORDER — MOMETASONE FURO-FORMOTEROL FUM 200-5 MCG/ACT IN AERO
2.0000 | INHALATION_SPRAY | Freq: Two times a day (BID) | RESPIRATORY_TRACT | Status: DC
  Administered 2017-08-17 – 2017-08-18 (×2): 2 via RESPIRATORY_TRACT
  Filled 2017-08-17: qty 8.8

## 2017-08-17 MED ORDER — CELECOXIB 200 MG PO CAPS: 200.0000 mg | ORAL_CAPSULE | Freq: Two times a day (BID) | ORAL | Status: DC

## 2017-08-17 MED ORDER — ZOLPIDEM TARTRATE 5 MG PO TABS
5.0000 mg | ORAL_TABLET | Freq: Every day | ORAL | Status: DC
  Administered 2017-08-17: 5 mg via ORAL
  Filled 2017-08-17: qty 1

## 2017-08-17 MED ORDER — ACETAMINOPHEN 325 MG PO TABS
650.0000 mg | ORAL_TABLET | Freq: Four times a day (QID) | ORAL | Status: DC | PRN
  Administered 2017-08-17 – 2017-08-18 (×2): 650 mg via ORAL
  Filled 2017-08-17 (×2): qty 2

## 2017-08-17 MED ORDER — METHYLPREDNISOLONE SODIUM SUCC 40 MG IJ SOLR
40.0000 mg | Freq: Two times a day (BID) | INTRAMUSCULAR | Status: DC
  Administered 2017-08-17: 40 mg via INTRAVENOUS
  Filled 2017-08-17: qty 1

## 2017-08-17 MED ORDER — ACETAMINOPHEN 650 MG RE SUPP: 650.0000 mg | Freq: Four times a day (QID) | RECTAL | Status: DC | PRN

## 2017-08-17 MED ORDER — PHENOL 1.4 % MT LIQD
1.0000 | OROMUCOSAL | Status: DC | PRN
  Filled 2017-08-17: qty 177

## 2017-08-17 MED ORDER — VANCOMYCIN HCL IN DEXTROSE 1-5 GM/200ML-% IV SOLN
INTRAVENOUS | Status: AC
  Filled 2017-08-17: qty 200

## 2017-08-17 SURGICAL SUPPLY — 60 items
BATTERY INSTRU NAVIGATION (MISCELLANEOUS) ×12 IMPLANT
BLADE SAW 1 (BLADE) ×3 IMPLANT
BLADE SAW 1/2 (BLADE) ×3 IMPLANT
BLADE SAW 70X12.5 (BLADE) IMPLANT
CANISTER SUCT 1200ML W/VALVE (MISCELLANEOUS) ×3 IMPLANT
CANISTER SUCT 3000ML PPV (MISCELLANEOUS) ×6 IMPLANT
COOLER POLAR GLACIER W/PUMP (MISCELLANEOUS) ×3 IMPLANT
CUFF TOURN 24 STER (MISCELLANEOUS) IMPLANT
CUFF TOURN 30 STER DUAL PORT (MISCELLANEOUS) IMPLANT
DRAPE SHEET LG 3/4 BI-LAMINATE (DRAPES) ×3 IMPLANT
DRSG DERMACEA 8X12 NADH (GAUZE/BANDAGES/DRESSINGS) ×3 IMPLANT
DRSG OPSITE POSTOP 4X14 (GAUZE/BANDAGES/DRESSINGS) ×3 IMPLANT
DRSG TEGADERM 4X4.75 (GAUZE/BANDAGES/DRESSINGS) ×3 IMPLANT
DURAPREP 26ML APPLICATOR (WOUND CARE) ×6 IMPLANT
ELECT CAUTERY BLADE 6.4 (BLADE) ×3 IMPLANT
ELECT REM PT RETURN 9FT ADLT (ELECTROSURGICAL) ×3
ELECTRODE REM PT RTRN 9FT ADLT (ELECTROSURGICAL) ×1 IMPLANT
EVACUATOR 1/8 PVC DRAIN (DRAIN) ×3 IMPLANT
EX-PIN ORTHOLOCK NAV 4X150 (PIN) ×6 IMPLANT
GLOVE BIOGEL M STRL SZ7.5 (GLOVE) ×6 IMPLANT
GLOVE BIOGEL PI IND STRL 9 (GLOVE) ×1 IMPLANT
GLOVE BIOGEL PI INDICATOR 9 (GLOVE) ×2
GLOVE INDICATOR 8.0 STRL GRN (GLOVE) ×3 IMPLANT
GLOVE SURG SYN 9.0  PF PI (GLOVE) ×2
GLOVE SURG SYN 9.0 PF PI (GLOVE) ×1 IMPLANT
GOWN STRL REUS W/ TWL LRG LVL3 (GOWN DISPOSABLE) ×2 IMPLANT
GOWN STRL REUS W/TWL 2XL LVL3 (GOWN DISPOSABLE) ×3 IMPLANT
GOWN STRL REUS W/TWL LRG LVL3 (GOWN DISPOSABLE) ×4
HOLDER FOLEY CATH W/STRAP (MISCELLANEOUS) ×3 IMPLANT
HOOD PEEL AWAY FLYTE STAYCOOL (MISCELLANEOUS) ×6 IMPLANT
KIT RM TURNOVER STRD PROC AR (KITS) ×3 IMPLANT
KNIFE SCULPS 14X20 (INSTRUMENTS) ×3 IMPLANT
LABEL OR SOLS (LABEL) ×3 IMPLANT
NDL SAFETY ECLIPSE 18X1.5 (NEEDLE) ×1 IMPLANT
NEEDLE HYPO 18GX1.5 SHARP (NEEDLE) ×2
NEEDLE SPNL 20GX3.5 QUINCKE YW (NEEDLE) ×6 IMPLANT
NS IRRIG 500ML POUR BTL (IV SOLUTION) ×3 IMPLANT
PACK TOTAL KNEE (MISCELLANEOUS) ×3 IMPLANT
PAD WRAPON POLAR KNEE (MISCELLANEOUS) ×1 IMPLANT
PIN FIXATION 1/8DIA X 3INL (PIN) ×3 IMPLANT
PULSAVAC PLUS IRRIG FAN TIP (DISPOSABLE) ×3
SOL .9 NS 3000ML IRR  AL (IV SOLUTION) ×2
SOL .9 NS 3000ML IRR UROMATIC (IV SOLUTION) ×1 IMPLANT
SOL PREP PVP 2OZ (MISCELLANEOUS) ×3
SOLUTION PREP PVP 2OZ (MISCELLANEOUS) ×1 IMPLANT
SPONGE DRAIN TRACH 4X4 STRL 2S (GAUZE/BANDAGES/DRESSINGS) ×3 IMPLANT
STAPLER SKIN PROX 35W (STAPLE) ×3 IMPLANT
STRAP TIBIA SHORT (MISCELLANEOUS) ×3 IMPLANT
SUCTION FRAZIER HANDLE 10FR (MISCELLANEOUS) ×2
SUCTION TUBE FRAZIER 10FR DISP (MISCELLANEOUS) ×1 IMPLANT
SUT VIC AB 0 CT1 36 (SUTURE) ×3 IMPLANT
SUT VIC AB 1 CT1 36 (SUTURE) ×6 IMPLANT
SUT VIC AB 2-0 CT2 27 (SUTURE) ×3 IMPLANT
SYR 20CC LL (SYRINGE) ×3 IMPLANT
SYR 30ML LL (SYRINGE) ×6 IMPLANT
TIP FAN IRRIG PULSAVAC PLUS (DISPOSABLE) ×1 IMPLANT
TOWEL OR 17X26 4PK STRL BLUE (TOWEL DISPOSABLE) ×3 IMPLANT
TOWER CARTRIDGE SMART MIX (DISPOSABLE) ×3 IMPLANT
TRAY FOLEY W/METER SILVER 16FR (SET/KITS/TRAYS/PACK) ×3 IMPLANT
WRAPON POLAR PAD KNEE (MISCELLANEOUS) ×3

## 2017-08-17 NOTE — OR Nursing (Signed)
Pt denies any c/o other than being sleepy. Taking fluids well. Watching TV

## 2017-08-17 NOTE — Progress Notes (Signed)
Additional 5 mcg epinephrine given IV by Dr. Kayleen Memos.  Upon completion of duoneb O2 sat 96% resp 24.  Sleepy but responding and talking.  Face red, throat feels better.  Dr. Marry Guan at bedside talking with patient.

## 2017-08-17 NOTE — OR Nursing (Signed)
Pt c/o face feeling numb and hot and feels like she is choking.vancomycin dcd Dr Kayleen Memos in the room. Pulse oximetry reads 97%. Benadryl ordered

## 2017-08-17 NOTE — Consult Note (Addendum)
[place] [facility] [place] NAME: Jo Malone    MR#:  [id number]  DATE OF BIRTH:  Mar 09, 1936  DATE OF ADMISSION:  08/17/2017  PRIMARY CARE PHYSICIAN: System, Pcp Not In   REQUESTING/REFERRING PHYSICIAN: Dr. Marry Guan  CHIEF COMPLAINT:  No chief complaint on file.  Vanco anaphylaxis HISTORY OF PRESENT ILLNESS:  Jo Malone  is a 81 y.o. female with a known history of asthma, arthritis and anxiety.  The patient was scheduled to have knee replacement.  She was given vancomycin due to no swipe of positive MRSA.  She felt like she was choking, had throat wheezing and body redness 10 minutes after infusion of vancomycin.  She was given nebulizer, Benadryl, IV Solu-Medrol and Decadron.  She is put on oxygen by nasal cannula. SAT 95%.  Symptoms resolved now.   Dr. Marry Guan requested consult from hospitalist. Dr. Kayleen Memos, anesthesiologist requested observation for this patient. PAST MEDICAL HISTORY:   Past Medical History:  Diagnosis Date  . Anxiety   . Arthritis   . Asthma   . GERD (gastroesophageal reflux disease)   . History of hiatal hernia   . Neuropathy     PAST SURGICAL HISTORY:   Past Surgical History:  Procedure Laterality Date  . ABDOMINAL HYSTERECTOMY    . APPENDECTOMY    . BACK SURGERY     metal plate and screws  . BREAST SURGERY     implants  . CATARACT EXTRACTION, BILATERAL    . COLONOSCOPY    . COLONOSCOPY WITH PROPOFOL N/A 05/11/2017   Procedure: COLONOSCOPY WITH PROPOFOL;  Surgeon: Manya Silvas, MD;  Location[facility] ENDOSCOPY;  Service: Endoscopy;  Laterality: N/A;  . DILATION AND CURETTAGE OF UTERUS    . ESOPHAGOGASTRODUODENOSCOPY (EGD) WITH PROPOFOL N/A 05/11/2017   Procedure: ESOPHAGOGASTRODUODENOSCOPY (EGD) WITH PROPOFOL;  Surgeon: Manya Silvas, MD;  Location: [facility] ENDOSCOPY;  Service: Endoscopy;  Laterality: N/A;  . FOOT SURGERY Bilateral   . HEMORRHOID SURGERY    . JOINT REPLACEMENT    . KNEE ARTHROPLASTY  Left 12/17/2015   Procedure: COMPUTER ASSISTED TOTAL KNEE ARTHROPLASTY;  Surgeon: Dereck Leep, MD;  Location: [facility] ORS;  Service: Orthopedics;  Laterality: Left;  . TUBAL LIGATION    . VARICOSE VEIN SURGERY      SOCIAL HISTORY:   Social History   Tobacco Use  . Smoking status: Never Smoker  . Smokeless tobacco: Never Used  Substance Use Topics  . Alcohol use: No    FAMILY HISTORY:  History reviewed. No pertinent family history.  DRUG ALLERGIES:   Allergies  Allergen Reactions  . Vancomycin Anaphylaxis  . Penicillins Hives    Has patient had a PCN reaction causing immediate rash, facial/tongue/throat swelling, SOB or lightheadedness with hypotension: No Has patient had a PCN reaction causing severe rash involving mucus membranes or skin necrosis: No Has patient had a PCN reaction that required hospitalization: No Has patient had a PCN reaction occurring within the last 10 years: No If all of the above answers are "NO", then may proceed with Cephalosporin use.   . Tramadol Diarrhea and Nausea Only    REVIEW OF SYSTEMS:   Review of Systems  Constitutional: Negative for chills and fever.  HENT: Negative for sore throat.   Eyes: Negative for blurred vision.  Respiratory: Positive for wheezing. Negative for cough and stridor.   Cardiovascular: Negative for chest pain.  Gastrointestinal: Negative for diarrhea, nausea and vomiting.  Neurological: Negative for dizziness and loss  of consciousness.    MEDICATIONS AT HOME:   Prior to Admission medications   Medication Sig Start Date End Date Taking? Authorizing Provider  acetaminophen (TYLENOL) 500 MG tablet Take 1,000 mg by mouth every 8 (eight) hours as needed for mild pain.   Yes [provider]  albuterol (PROAIR HFA) 108 (90 Base) MCG/ACT inhaler Inhale 2 puffs into the lungs 2 (two) times daily as needed for wheezing or shortness of breath.   Yes [provider]  albuterol (PROVENTIL) (2.5 MG/3ML)  0.083% nebulizer solution Take 2.5 mg by nebulization every 6 (six) hours as needed for wheezing or shortness of breath.   Yes [provider]  ALPRAZolam (XANAX) 0.25 MG tablet Take 0.25 mg by mouth 2 (two) times daily as needed for anxiety.    Yes [provider]  budesonide-formoterol (SYMBICORT) 160-4.5 MCG/ACT inhaler Inhale 2 puffs into the lungs 2 (two) times daily as needed (shortness of breath).    Yes [provider]  cholecalciferol (VITAMIN D) 1000 units tablet Take 1,000 Units by mouth daily.   Yes [provider]  Cyanocobalamin (VITAMIN B-12 CR PO) Take 1,000 mg by mouth daily.   Yes [provider]  furosemide (LASIX) 40 MG tablet Take 40 mg by mouth daily as needed for fluid or edema.    Yes [provider]  gabapentin (NEURONTIN) 600 MG tablet Take 600 mg by mouth 2 (two) times daily. One half tablet after lunch and one half tablet at bedtime    Yes [provider]  montelukast (SINGULAIR) 10 MG tablet Take 10 mg by mouth at bedtime.   Yes [provider]  pantoprazole (PROTONIX) 40 MG tablet Take 1 tablet (40 mg total) by mouth daily. 02/22/17 02/22/18 Yes Harvest Dark, MD  Potassium 99 MG TABS Take 99 mg by mouth daily.    Yes [provider]  predniSONE (DELTASONE) 5 MG tablet Take 5 mg by mouth daily with breakfast.   Yes [provider]  sertraline (ZOLOFT) 50 MG tablet Take 50 mg by mouth daily.   Yes [provider]  zolpidem (AMBIEN) 10 MG tablet Take 10 mg by mouth at bedtime.    Yes [provider]  Cholecalciferol (VITAMIN D-3 PO) Take 5,000 Units by mouth daily.    [provider]      VITAL SIGNS:  Blood pressure 136/65, pulse 88, temperature 98.4 F (36.9 C), temperature source Oral, resp. rate 18, height 5\' 2"  (1.575 m), weight 197 lb (89.4 kg), SpO2 96 %.  PHYSICAL EXAMINATION:  Physical Exam  GENERAL:  81 y.o.-year-old patient lying in the  bed with no acute distress.  EYES: Pupils equal, round, reactive to light and accommodation. No scleral icterus. Extraocular muscles intact.  HEENT: Head atraumatic, normocephalic. Oropharynx and nasopharynx clear.  NECK:  Supple, no jugular venous distention. No thyroid enlargement, no tenderness.  LUNGS: Normal breath sounds bilaterally, no wheezing, rales,rhonchi or crepitation. No use of accessory muscles of respiration.  CARDIOVASCULAR: S1, S2 normal. No murmurs, rubs, or gallops.  ABDOMEN: Soft, nontender, nondistended. Bowel sounds present. No organomegaly or mass.  EXTREMITIES: No pedal edema, cyanosis, or clubbing.  NEUROLOGIC: Cranial nerves II through XII are intact. Muscle strength 5/5 in all extremities. Sensation intact. Gait not checked.  PSYCHIATRIC: The patient is alert and oriented x 3.  SKIN: No obvious rash, lesion, or ulcer.   LABORATORY PANEL:   CBC No results for input(s): WBC, HGB, HCT, PLT in the last 168 hours. ------------------------------------------------------------------------------------------------------------------  Chemistries  No results for input(s): NA, K, CL, CO2, GLUCOSE, BUN, CREATININE, CALCIUM, MG, AST, ALT, ALKPHOS, BILITOT in the last 168 hours.  Invalid input(s): GFRCGP ------------------------------------------------------------------------------------------------------------------  Cardiac Enzymes No results for input(s): TROPONINI in the last 168 hours. ------------------------------------------------------------------------------------------------------------------  RADIOLOGY:  No results found.    IMPRESSION AND PLAN:   Vancomycin anaphylaxis. The patient will be placed for observation. The patient already got DuoNeb, Benadryl, IV Solu-Medrol and decadron. Continue IV Solu-Medrol and Benadryl as needed.  May change to prednisone taper tomorrow.  Arthritis of knee.  Pain control and follow-up with Dr. Marry Guan.  Asthma.   Stable.  Nebulizer as needed.  All the records are reviewed and case discussed with Dr. Marry Guan and nurse Management plans discussed with the patient, family and they are in agreement.  CODE STATUS: Full code  TOTAL TIME TAKING CARE OF THIS PATIENT: 42 minutes.    Demetrios Loll M.D on 08/17/2017 at 12:35 PM  Between 7am to 6pm - Pager - [phone]  After 6pm go to [email or web address] - Proofreader  [facility] [place] [facility]  Office  [phone]  CC: Primary care physician; System, Pcp Not In   Note: This dictation was prepared with Dragon dictation along with smaller phrase technology. Any transcriptional errors that result from this process are unin

## 2017-08-17 NOTE — OR Nursing (Signed)
Dr. Carroll into see pt.   

## 2017-08-17 NOTE — OR Nursing (Signed)
Dr Bridgett Larsson in to examine pt. Monitor patient until 2 pm and have anesthesiologist re-evaluate and if no wheezing may be discharged to home. I patient has wheezing may consider overnight observation.

## 2017-08-17 NOTE — Anesthesia Preprocedure Evaluation (Deleted)
Anesthesia Evaluation  Patient identified by MRN, date of birth, ID band Patient awake    Reviewed: Allergy & Precautions, NPO status , Patient's Chart, lab work & pertinent test results  History of Anesthesia Complications Negative for: history of anesthetic complications  Airway Mallampati: II  TM Distance: <3 FB     Dental  (+) Caps, Teeth Intact   Pulmonary asthma ,    Pulmonary exam normal        Cardiovascular negative cardio ROS Normal cardiovascular exam     Neuro/Psych Anxiety  Neuromuscular disease negative neurological ROS     GI/Hepatic Neg liver ROS, hiatal hernia, GERD  ,  Endo/Other  negative endocrine ROS  Renal/GU negative Renal ROS     Musculoskeletal  (+) Arthritis , Osteoarthritis,    Abdominal Normal abdominal exam  (+)   Peds negative pediatric ROS (+)  Hematology negative hematology ROS (+)   Anesthesia Other Findings   Reproductive/Obstetrics                            Anesthesia Physical  Anesthesia Plan  ASA: II  Anesthesia Plan: Spinal   Post-op Pain Management:    Induction:   PONV Risk Score and Plan:   Airway Management Planned: Nasal Cannula  Additional Equipment:   Intra-op Plan:   Post-operative Plan:   Informed Consent: I have reviewed the patients History and Physical, chart, labs and discussed the procedure including the risks, benefits and alternatives for the proposed anesthesia with the patient or authorized representative who has indicated his/her understanding and acceptance.     Plan Discussed with:   Anesthesia Plan Comments:         Anesthesia Quick Evaluation

## 2017-08-17 NOTE — H&P (Signed)
The patient has been re-examined, and the chart reviewed, and there have been no interval changes to the documented history and physical.    The risks, benefits, and alternatives have been discussed at length. The patient expressed understanding of the risks benefits and agreed with plans for surgical intervention.  Valerie Fredin P. Brigg Cape, Jr. M.D.    

## 2017-08-17 NOTE — OR Nursing (Signed)
Dr Kayleen Memos in to see pt - decision to keep pt in obs - Dr Bridgett Larsson notified

## 2017-08-17 NOTE — Progress Notes (Addendum)
Dr. Kayleen Memos assisting respirations with bag valve mask, 100% O2  Actual time of event 1122am

## 2017-08-17 NOTE — Progress Notes (Signed)
Pt admitted from same day surgery after TKA surgery was cancelled. Pt is alert & oriented x4, VSS, denies pain. Pt ambulated to bathroom with 1 assist, she does get SOB with exertion, however she states that is her baseline d/t her asthma.   [place], Jerry Caras

## 2017-08-17 NOTE — Progress Notes (Signed)
Request for Advanced Directive. Upon arrival Jo Malone was lying in bed. She states she completed a living will many years ago. An advanced directive was provided to Jo Malone with education. She shared some of her wishes which chaplain encouraged her to put in writing and to share with those who care for her. Jo Malone was thankful for care and requested prayer. Prayer was offered.     08/17/17 1800  Clinical Encounter Type  Visited With Patient  Visit Type Initial  Referral From Nurse

## 2017-08-17 NOTE — Discharge Instructions (Signed)
°  Instructions after Total Knee Replacement ° ° Jo Malone, Jr., M.D.    ° Dept. of Orthopaedics & Sports Medicine ° [facility] ° [address] ° [place],   [address] ° Phone: [phone]   Fax: [phone] ° °  °DIET: °• Drink plenty of non-alcoholic fluids. °• Resume your normal diet. Include foods high in fiber. ° °ACTIVITY:  °• You may use crutches or a walker with weight-bearing as tolerated, unless instructed otherwise. °• You may be weaned off of the walker or crutches by your Physical Therapist.  °• Do NOT place pillows under the knee. Anything placed under the knee could limit your ability to straighten the knee.   °• Continue doing gentle exercises. Exercising will reduce the pain and swelling, increase motion, and prevent muscle weakness.   °• Please continue to use the TED compression stockings for 6 weeks. You may remove the stockings at night, but should reapply them in the morning. °• Do not drive or operate any equipment until instructed. ° °WOUND CARE:  °• Continue to use the PolarCare or ice packs periodically to reduce pain and swelling. °• You may bathe or shower after the staples are removed at the first office visit following surgery. ° °MEDICATIONS: °• You may resume your regular medications. °• Please take the pain medication as prescribed on the medication. °• Do not take pain medication on an empty stomach. °• You have been given a prescription for a blood thinner (Lovenox or Coumadin). Please take the medication as instructed. (NOTE: After completing a 2 week course of Lovenox, take one Enteric-coated aspirin once a day. This along with elevation will help reduce the possibility of phlebitis in your operated leg.) °• Do not drive or drink alcoholic beverages when taking pain medications. ° °CALL THE OFFICE FOR: °• Temperature above 101 degrees °• Excessive bleeding or drainage on the dressing. °• Excessive swelling, coldness, or paleness of the toes. °• Persistent  nausea and vomiting. ° °FOLLOW-UP:  °• You should have an appointment to return to the office in 10-14 days after surgery. °• Arrangements have been made for continuation of Physical Therapy (either home therapy or outpatient therapy). °  °

## 2017-08-17 NOTE — Progress Notes (Signed)
Patient breathing spontaneously O2 via [place] at 4L, alert and states that throat feels better, audible wheezing, O2 sat 96%, will administer duoneb as per order of Dr. Kayleen Memos. Epinephrine 76mcg given IV by Dr. Kayleen Memos.

## 2017-08-18 DIAGNOSIS — M1711 Unilateral primary osteoarthritis, right knee: Secondary | ICD-10-CM | POA: Diagnosis not present

## 2017-08-18 MED ORDER — AZITHROMYCIN 250 MG PO TABS: ORAL_TABLET | ORAL | 0 refills | Status: AC

## 2017-08-18 NOTE — Care Management Obs Status (Signed)
[place] NOTIFICATION   Patient Details  Name: Jo Malone MRN: [id number] Date of Birth: 07/20/1936   Medicare Observation Status Notification Given:  Yes    [name], RN 08/18/2017, 10:49 AM

## 2017-08-18 NOTE — Progress Notes (Signed)
Pt ready for discharge to home with family. Daughter at bedside. Reviewed d/c instructions prescriptions and f/u appts.

## 2017-08-18 NOTE — Care Management CC44 (Signed)
Condition Code 44 Documentation Completed  Patient Details  Name: Jo Malone MRN: [id number] Date of Birth: 04/23/1936   Condition Code 44 given:  Yes Patient signature on Condition Code 44 notice:  Yes Documentation of 2 MD's agreement:  Yes Code 44 added to claim:  Yes    [name], RN 08/18/2017, 10:49 AM

## 2017-08-18 NOTE — Discharge Summary (Signed)
MACLAINE AHOLA, is a 81 y.o. female  DOB September 08, 1935  MRN [id number].  Admission date:  08/17/2017  Admitting Physician  Demetrios Loll, MD  Discharge Date:  08/18/2017   Primary MD  System, Pcp Not In  Recommendations for primary care physician for things to follow:   Follow-up with PCP in 1 week patient's PCP is  Mariana Arn Follow-up with Dr. Marry Guan in 2 weeks   Admission Diagnosis  PRIMARY OSTEOARTHRITIS OF RIGHT KNEE   Discharge Diagnosis  PRIMARY OSTEOARTHRITIS OF RIGHT KNEE    Active Problems:   S/P total knee arthroplasty   Anaphylaxis      Past Medical History:  Diagnosis Date  . Anxiety   . Arthritis   . Asthma   . GERD (gastroesophageal reflux disease)   . History of hiatal hernia   . Neuropathy     Past Surgical History:  Procedure Laterality Date  . ABDOMINAL HYSTERECTOMY    . APPENDECTOMY    . BACK SURGERY     metal plate and screws  . BREAST SURGERY     implants  . CATARACT EXTRACTION, BILATERAL    . COLONOSCOPY    . COLONOSCOPY WITH PROPOFOL N/A 05/11/2017   Procedure: COLONOSCOPY WITH PROPOFOL;  Surgeon: Manya Silvas, MD;  Location: [facility] ENDOSCOPY;  Service: Endoscopy;  Laterality: N/A;  . DILATION AND CURETTAGE OF UTERUS    . ESOPHAGOGASTRODUODENOSCOPY (EGD) WITH PROPOFOL N/A 05/11/2017   Procedure: ESOPHAGOGASTRODUODENOSCOPY (EGD) WITH PROPOFOL;  Surgeon: Manya Silvas, MD;  Location: [facility] ENDOSCOPY;  Service: Endoscopy;  Laterality: N/A;  . FOOT SURGERY Bilateral   . HEMORRHOID SURGERY    . JOINT REPLACEMENT    . KNEE ARTHROPLASTY Left 12/17/2015   Procedure: COMPUTER ASSISTED TOTAL KNEE ARTHROPLASTY;  Surgeon: Dereck Leep, MD;  Location: [facility] ORS;  Service: Orthopedics;  Laterality: Left;  . TUBAL LIGATION    . VARICOSE VEIN SURGERY         History of present illness  and  Hospital Course:     Kindly see H&P for history of present illness and admission details, please review complete Labs, Consult reports and Test reports for all details in brief  HPI  from the history and physical done on the day of admission 81 year old female patient with history of arthritis supposed to have right knee replacement by Dr. Marry Guan yesterday, patient received vancomycin because of history of MRSA after receiving vancomycin patient had anaphylactic reaction with vancomycin with feeling of choking, shortness of breath.  And also felt flushing in the face.  Admitted to observation status overnight for monitoring of anaphylaxis.   Hospital Course  #1.  Allergic reaction to vancomycin: Admitted to observation status overnight, patient received a Decadron, Benadryl, albuterol nebulizer.  Patient is much better, no tongue swelling today.  Also denies any choking sensation, shortness of breath.  However she has asthma and also complains of cough and she is requesting for azithromycin.  Will call in for Z-Pak for her and she is stable for discharge she does have albuterol nebulizer that she can use at home, I called the daughter and informed about the discharge plan.  And also told her that she is allergic to vancomycin and also penicillin and she needs to inform medical professionals in the future.  No further need for steroids. 2 history of anxiety: Continue Xanax 3.  History of depression: Continue Zoloft.  Discharge Condition:    Follow UP  Follow-up Information    Damaris Hippo  [place], PA-C Follow up on 08/31/2017.   Specialty:  Physician Assistant Why:  at 9:30am Contact information: [place] [address] [phone]        Dereck Leep, MD Follow up on 09/27/2017.   Specialty:  Orthopedic Surgery Why:  at 9:45am Contact information: [address] [place] [place] [address] [phone]              Discharge Instructions  and  Discharge Medications      Allergies as of 08/18/2017      Reactions   Vancomycin Anaphylaxis   Penicillins Hives   Has patient had a PCN reaction causing immediate rash, facial/tongue/throat swelling, SOB or lightheadedness with hypotension: No Has patient had a PCN reaction causing severe rash involving mucus membranes or skin necrosis: No Has patient had a PCN reaction that required hospitalization: No Has patient had a PCN reaction occurring within the last 10 years: No If all of the above answers are "NO", then may proceed with Cephalosporin use.   Tramadol Diarrhea, Nausea Only      Medication List    TAKE these medications   acetaminophen 500 MG tablet Commonly known as:  TYLENOL Take 1,000 mg by mouth every 8 (eight) hours as needed for mild pain.   ALPRAZolam 0.25 MG tablet Commonly known as:  XANAX Take 0.25 mg by mouth 2 (two) times daily as needed for anxiety.   azithromycin 250 MG tablet Commonly known as:  ZITHROMAX Z-PAK Take 2 tablets (500 mg) on  Day 1,  followed by 1 tablet (250 mg) once daily on Days 2 through 5.   furosemide 40 MG tablet Commonly known as:  LASIX Take 40 mg by mouth daily as needed for fluid or edema.   gabapentin 600 MG tablet Commonly known as:  NEURONTIN Take 600 mg by mouth 2 (two) times daily. One half tablet after lunch and one half tablet at bedtime   montelukast 10 MG tablet Commonly known as:  SINGULAIR Take 10 mg by mouth at bedtime.   pantoprazole 40 MG tablet Commonly known as:  PROTONIX Take 1 tablet (40 mg total) by mouth daily.   Potassium 99 MG Tabs Take 99 mg by mouth daily.   predniSONE 5 MG tablet Commonly known as:  DELTASONE Take 5 mg by mouth daily with breakfast.   PROAIR HFA 108 (90 Base) MCG/ACT inhaler Generic drug:  albuterol Inhale 2 puffs into the lungs 2 (two) times daily as needed for wheezing or shortness of breath.   albuterol (2.5 MG/3ML) 0.083%  nebulizer solution Commonly known as:  PROVENTIL Take 2.5 mg by nebulization every 6 (six) hours as needed for wheezing or shortness of breath.   sertraline 50 MG tablet Commonly known as:  ZOLOFT Take 50 mg by mouth daily.   SYMBICORT 160-4.5 MCG/ACT inhaler Generic drug:  budesonide-formoterol Inhale 2 puffs into the lungs 2 (two) times daily as needed (shortness of breath).   VITAMIN B-12 CR PO Take 1,000 mg by mouth daily.   VITAMIN D-3 PO Take 5,000 Units by mouth daily.   cholecalciferol 1000 units tablet Commonly known as:  VITAMIN D Take 1,000 Units by mouth daily.   zolpidem 10 MG tablet Commonly known as:  AMBIEN Take 10 mg by mouth at bedtime.         Diet and Activity recommendation: See Discharge Instructions above   Consults obtained -ortho   Major procedures and Radiology Reports - PLEASE  review detailed and final reports for all details, in brief -      No results found.  Micro Results    No results found for this or any previous visit (from the past 240 hour(s)).     Today   Subjective:   Chloe Flis today has no headache,no chest abdominal pain,no new weakness tingling or numbness, feels much better wants to go home today.  Objective:   Blood pressure 122/64, pulse 97, temperature 98.4 F (36.9 C), temperature source Oral, resp. rate 18, height 5\' 2"  (1.575 m), weight 89.4 kg (197 lb), SpO2 94 %.   Intake/Output Summary (Last 24 hours) at 08/18/2017 0957 Last data filed at 08/17/2017 1833 Gross per 24 hour  Intake 496.67 ml  Output -  Net 496.67 ml    Exam Awake Alert, Oriented x 3, No new F.N deficits, Normal affect [place].AT,PERRAL Supple Neck,No JVD, No cervical lymphadenopathy appriciated.  Symmetrical Chest wall movement, Good air movement bilaterally, CTAB RRR,No Gallops,Rubs or new Murmurs, No Parasternal Heave +ve B.Sounds, Abd Soft, Non tender, No organomegaly appriciated, No rebound -guarding or rigidity. No  Cyanosis, Clubbing or edema, No new Rash or bruise  Data Review   CBC w Diff:  Lab Results  Component Value Date   WBC 7.7 02/22/2017   HGB 12.6 02/22/2017   HCT 37.6 02/22/2017   PLT 212 02/22/2017    CMP:  Lab Results  Component Value Date   NA 142 02/22/2017   K 3.7 02/22/2017   CL 111 02/22/2017   CO2 22 02/22/2017   BUN 17 02/22/2017   CREATININE 0.71 02/22/2017   PROT 7.4 02/22/2017   ALBUMIN 3.9 02/22/2017   BILITOT 0.4 02/22/2017   ALKPHOS 74 02/22/2017   AST 42 (H) 02/22/2017   ALT 24 02/22/2017  .   Total Time in preparing paper work, data evaluation and todays exam - 35 minutes  Epifanio Lesches M.D on 08/18/2017 at 9:57 AM    Note: This dictation was prepared with Dragon dictation along with smaller phrase technology. Any transcriptional errors that result from this process are unintentional.

## 2017-08-18 NOTE — Plan of Care (Signed)
  Completed/Met Education: Knowledge of General Education information will improve 08/18/2017 1258 - Completed/Met by Milderd Meager, RN Health Behavior/Discharge Planning: Ability to manage health-related needs will improve 08/18/2017 1258 - Completed/Met by Milderd Meager, RN Clinical Measurements: Ability to maintain clinical measurements within normal limits will improve 08/18/2017 1258 - Completed/Met by Milderd Meager, RN Will remain free from infection 08/18/2017 1258 - Completed/Met by Milderd Meager, RN Diagnostic test results will improve 08/18/2017 1258 - Completed/Met by Milderd Meager, RN Respiratory complications will improve 08/18/2017 1258 - Completed/Met by Milderd Meager, RN Cardiovascular complication will be avoided 08/18/2017 1258 - Completed/Met by Milderd Meager, RN Activity: Risk for activity intolerance will decrease 08/18/2017 1258 - Completed/Met by Milderd Meager, RN Nutrition: Adequate nutrition will be maintained 08/18/2017 1258 - Completed/Met by Milderd Meager, RN Coping: Level of anxiety will decrease 08/18/2017 1258 - Completed/Met by Milderd Meager, RN Elimination: Will not experience complications related to bowel motility 08/18/2017 1258 - Completed/Met by Milderd Meager, RN Will not experience complications related to urinary retention 08/18/2017 1258 - Completed/Met by Milderd Meager, RN Pain Managment: General experience of comfort will improve 08/18/2017 1258 - Completed/Met by Milderd Meager, RN Safety: Ability to remain free from injury will improve 08/18/2017 1258 - Completed/Met by Milderd Meager, RN Skin Integrity: Risk for impaired skin integrity will decrease 08/18/2017 1258 - Completed/Met by Milderd Meager, RN

## 2017-08-19 MED FILL — Medication: Qty: 1 | Status: AC

## 2017-09-26 ENCOUNTER — Inpatient Hospital Stay: Admit: 2017-09-26 | Payer: Medicare Other | Admitting: Orthopedic Surgery

## 2017-09-26 SURGERY — ARTHROPLASTY, KNEE, TOTAL, USING IMAGELESS COMPUTER-ASSISTED NAVIGATION
Anesthesia: Choice | Laterality: Right

## 2018-01-05 ENCOUNTER — Inpatient Hospital Stay: Admission: RE | Admit: 2018-01-05 | Payer: Medicare Other | Source: Ambulatory Visit

## 2018-01-18 ENCOUNTER — Encounter: Admission: RE | Payer: Self-pay | Source: Ambulatory Visit

## 2018-01-18 ENCOUNTER — Inpatient Hospital Stay: Admission: RE | Admit: 2018-01-18 | Payer: Medicare Other | Source: Ambulatory Visit | Admitting: Orthopedic Surgery

## 2018-01-18 SURGERY — ARTHROPLASTY, KNEE, TOTAL, USING IMAGELESS COMPUTER-ASSISTED NAVIGATION
Anesthesia: Choice | Laterality: Right

## 2018-10-26 ENCOUNTER — Ambulatory Visit: Payer: Self-pay | Admitting: Orthopedic Surgery

## 2018-11-08 ENCOUNTER — Other Ambulatory Visit: Payer: Medicare Other

## 2018-11-17 ENCOUNTER — Encounter
Admission: RE | Admit: 2018-11-17 | Discharge: 2018-11-17 | Disposition: A | Discharge status: Home / Self Care | Payer: Medicare Other | Source: Ambulatory Visit | Attending: Orthopedic Surgery | Admitting: Orthopedic Surgery

## 2018-11-17 ENCOUNTER — Other Ambulatory Visit: Payer: Self-pay

## 2018-11-17 DIAGNOSIS — Z01812 Encounter for preprocedural laboratory examination: Secondary | ICD-10-CM | POA: Diagnosis not present

## 2018-11-17 HISTORY — DX: Adverse effect of unspecified anesthetic, initial encounter: T41.45XA

## 2018-11-17 HISTORY — DX: Other complications of anesthesia, initial encounter: T88.59XA

## 2018-11-17 LAB — URINALYSIS, ROUTINE W REFLEX MICROSCOPIC
Bacteria, UA: NONE SEEN
Bilirubin Urine: NEGATIVE
GLUCOSE, UA: NEGATIVE mg/dL
Hgb urine dipstick: NEGATIVE
Ketones, ur: NEGATIVE mg/dL
Leukocytes,Ua: NEGATIVE
Nitrite: NEGATIVE
Protein, ur: NEGATIVE mg/dL
Specific Gravity, Urine: 1.018 (ref 1.005–1.030)
pH: 5 (ref 5.0–8.0)

## 2018-11-17 LAB — TYPE AND SCREEN
ABO/RH(D): O POS
ANTIBODY SCREEN: NEGATIVE

## 2018-11-17 LAB — PROTIME-INR
INR: 0.9 (ref 0.8–1.2)
Prothrombin Time: 11.9 seconds (ref 11.4–15.2)

## 2018-11-17 LAB — BASIC METABOLIC PANEL
Anion gap: 10 (ref 5–15)
BUN: 16 mg/dL (ref 8–23)
CALCIUM: 10.3 mg/dL (ref 8.9–10.3)
CO2: 27 mmol/L (ref 22–32)
Chloride: 104 mmol/L (ref 98–111)
Creatinine, Ser: 0.86 mg/dL (ref 0.44–1.00)
GFR calc Af Amer: >60 mL/min (ref >60)
GFR calc non Af Amer: >60 mL/min (ref >60)
GLUCOSE: 93 mg/dL (ref 70–99)
POTASSIUM: 3.5 mmol/L (ref 3.5–5.1)
Sodium: 141 mmol/L (ref 135–145)

## 2018-11-17 LAB — SURGICAL PCR SCREEN
MRSA, PCR: NEGATIVE
Staphylococcus aureus: NEGATIVE

## 2018-11-17 LAB — APTT: aPTT: 27 seconds (ref 24–36)

## 2018-11-17 NOTE — Patient Instructions (Signed)
Your procedure is scheduled on: Tuesday 11/21/18.  Report to DAY SURGERY DEPARTMENT LOCATED ON 2ND FLOOR MEDICAL MALL ENTRANCE. To find out your arrival time please call [phone] between 1PM - 3PM on Monday 11/20/18.   Remember: Instructions that are not followed completely may result in serious medical risk, up to and including death, or upon the discretion of your surgeon and anesthesiologist your surgery may need to be rescheduled.      _X__ 1. Do not eat food after midnight the night before your procedure.                 No gum chewing or hard candies. You may drink clear liquids up to 2 hours                 before you are scheduled to arrive for your surgery- DO NOT drink clear                 liquids within 2 hours of the start of your surgery.                 Clear Liquids include:  water, apple juice without pulp, clear carbohydrate                 drink such as Clearfast or Gatorade, Black Coffee or Tea (Do not add                 milk or creamer to coffee or tea).   __X__2.  On the morning of surgery brush your teeth with toothpaste and water, you may rinse your mouth with mouthwash if you wish.  Do not swallow any toothpaste or mouthwash.      __X__6.  Notify your doctor if there is any change in your medical condition      (cold, fever, infections).       Do not wear jewelry, make-up, hairpins, clips or nail polish. Do not wear lotions, powders, or perfumes.  Do not shave 48 hours prior to surgery. Men may shave face and neck. Do not bring valuables to the hospital.      [facility] is not responsible for any belongings or valuables.   Leave your suitcase in the car. After surgery it may be brought to your room.   For patients admitted to the hospital, discharge time is determined by your treatment team.      Please read over the following fact sheets that you were given:   MRSA Information    __X__ Take these medicines the morning of surgery with A  SIP OF WATER:     1. albuterol (PROAIR HFA) 108 (90 Base) MCG/ACT inhaler  2. budesonide-formoterol (SYMBICORT) 160-4.5 MCG/ACT inhaler  3. gabapentin (NEURONTIN) 600 MG tablet  4. sertraline (ZOLOFT) 50 MG tablet  5. ALPRAZolam (XANAX) 0.25 MG tablet if needed      __X__ Use CHG Soap as directed   _ X___ Use inhalers on the day of surgery. Also bring the inhaler with you to the hospital on the morning of surgery.    __X__ Stop Anti-inflammatories 7 days before surgery such as Advil, Ibuprofen, Motrin, BC or Goodies Powder, Naprosyn, Naproxen, Aleve, Aspirin, Meloxicam. May take Tylenol if needed for pain or discomfort.   __X__ Do not begin taking any new herbal supplements before your surgery.

## 2018-11-20 MED ORDER — TRANEXAMIC ACID-NACL 1000-0.7 MG/100ML-% IV SOLN
1000.0000 mg | INTRAVENOUS | Status: AC
  Administered 2018-11-21: 1000 mg via INTRAVENOUS
  Filled 2018-11-20: qty 100

## 2018-11-20 MED ORDER — CLINDAMYCIN PHOSPHATE 900 MG/50ML IV SOLN
900.0000 mg | INTRAVENOUS | Status: AC
  Administered 2018-11-21: 900 mg via INTRAVENOUS

## 2018-11-21 ENCOUNTER — Other Ambulatory Visit: Payer: Self-pay

## 2018-11-21 ENCOUNTER — Inpatient Hospital Stay: Payer: Medicare Other

## 2018-11-21 ENCOUNTER — Ambulatory Visit: Payer: Medicare Other | Admitting: Anesthesiology

## 2018-11-21 ENCOUNTER — Observation Stay
Admission: AD | Admit: 2018-11-21 | Discharge: 2018-11-24 | Disposition: A | Discharge status: Home / Self Care | Payer: Medicare Other | Attending: Orthopedic Surgery | Admitting: Orthopedic Surgery

## 2018-11-21 ENCOUNTER — Encounter
Admission: AD | Disposition: A | Discharge status: Home / Self Care | Payer: Self-pay | Source: Home / Self Care | Attending: Orthopedic Surgery

## 2018-11-21 ENCOUNTER — Encounter: Payer: Self-pay | Admitting: Anesthesiology

## 2018-11-21 DIAGNOSIS — M25761 Osteophyte, right knee: Secondary | ICD-10-CM | POA: Diagnosis not present

## 2018-11-21 DIAGNOSIS — M25561 Pain in right knee: Secondary | ICD-10-CM | POA: Diagnosis present

## 2018-11-21 DIAGNOSIS — Z09 Encounter for follow-up examination after completed treatment for conditions other than malignant neoplasm: Secondary | ICD-10-CM

## 2018-11-21 DIAGNOSIS — M1711 Unilateral primary osteoarthritis, right knee: Principal | ICD-10-CM | POA: Insufficient documentation

## 2018-11-21 HISTORY — PX: TOTAL KNEE ARTHROPLASTY: SHX125

## 2018-11-21 LAB — CBC
HCT: 37 % (ref 36.0–46.0)
HEMOGLOBIN: 12 g/dL (ref 12.0–15.0)
MCH: 28.2 pg (ref 26.0–34.0)
MCHC: 32.4 g/dL (ref 30.0–36.0)
MCV: 87.1 fL (ref 80.0–100.0)
Platelets: 235 10*3/uL (ref 150–400)
RBC: 4.25 MIL/uL (ref 3.87–5.11)
RDW: 13.9 % (ref 11.5–15.5)
WBC: 10.6 10*3/uL — ABNORMAL HIGH (ref 4.0–10.5)
nRBC: 0 % (ref 0.0–0.2)

## 2018-11-21 SURGERY — ARTHROPLASTY, KNEE, TOTAL
Anesthesia: Spinal | Laterality: Right

## 2018-11-21 MED ORDER — FENTANYL CITRATE (PF) 100 MCG/2ML IJ SOLN
INTRAMUSCULAR | Status: AC
  Administered 2018-11-21: 50 ug via INTRAVENOUS
  Filled 2018-11-21: qty 2

## 2018-11-21 MED ORDER — MAGNESIUM CITRATE PO SOLN
1.0000 | Freq: Once | ORAL | Status: DC | PRN
  Filled 2018-11-21: qty 296

## 2018-11-21 MED ORDER — LIDOCAINE HCL (PF) 1 % IJ SOLN
INTRAMUSCULAR | Status: AC
  Filled 2018-11-21: qty 5

## 2018-11-21 MED ORDER — MOMETASONE FURO-FORMOTEROL FUM 200-5 MCG/ACT IN AERO
2.0000 | INHALATION_SPRAY | Freq: Two times a day (BID) | RESPIRATORY_TRACT | Status: DC
  Administered 2018-11-21 – 2018-11-24 (×6): 2 via RESPIRATORY_TRACT
  Filled 2018-11-21: qty 8.8

## 2018-11-21 MED ORDER — ACETAMINOPHEN 500 MG PO TABS
1000.0000 mg | ORAL_TABLET | Freq: Once | ORAL | Status: AC
  Administered 2018-11-21: 1000 mg via ORAL

## 2018-11-21 MED ORDER — LACTATED RINGERS IV SOLN
INTRAVENOUS | Status: DC
  Administered 2018-11-21: 18:00:00 via INTRAVENOUS

## 2018-11-21 MED ORDER — SODIUM CHLORIDE FLUSH 0.9 % IV SOLN
INTRAVENOUS | Status: AC
  Filled 2018-11-21: qty 40

## 2018-11-21 MED ORDER — MIDAZOLAM HCL 2 MG/2ML IJ SOLN
1.0000 mg | Freq: Once | INTRAMUSCULAR | Status: AC
  Administered 2018-11-21: 1 mg via INTRAVENOUS

## 2018-11-21 MED ORDER — PROPOFOL 500 MG/50ML IV EMUL
INTRAVENOUS | Status: DC | PRN
  Administered 2018-11-21: 75 ug/kg/min via INTRAVENOUS

## 2018-11-21 MED ORDER — PROPOFOL 10 MG/ML IV BOLUS
INTRAVENOUS | Status: DC | PRN
  Administered 2018-11-21 (×2): 30 mg via INTRAVENOUS

## 2018-11-21 MED ORDER — LACTATED RINGERS IV SOLN: INTRAVENOUS | Status: DC

## 2018-11-21 MED ORDER — HYDROCODONE-ACETAMINOPHEN 7.5-325 MG PO TABS: 1.0000 | ORAL_TABLET | ORAL | Status: DC | PRN

## 2018-11-21 MED ORDER — GABAPENTIN 600 MG PO TABS
600.0000 mg | ORAL_TABLET | Freq: Three times a day (TID) | ORAL | Status: DC
  Administered 2018-11-21 – 2018-11-24 (×8): 600 mg via ORAL
  Filled 2018-11-21 (×10): qty 1

## 2018-11-21 MED ORDER — VITAMIN D 25 MCG (1000 UNIT) PO TABS
1000.0000 [IU] | ORAL_TABLET | Freq: Every day | ORAL | Status: DC
  Administered 2018-11-22 – 2018-11-24 (×3): 1000 [IU] via ORAL
  Filled 2018-11-21 (×3): qty 1

## 2018-11-21 MED ORDER — HYDROCODONE-ACETAMINOPHEN 5-325 MG PO TABS
1.0000 | ORAL_TABLET | ORAL | Status: DC | PRN
  Administered 2018-11-21 – 2018-11-24 (×9): 1 via ORAL
  Filled 2018-11-21 (×9): qty 1

## 2018-11-21 MED ORDER — METOCLOPRAMIDE HCL 10 MG PO TABS: 5.0000 mg | ORAL_TABLET | Freq: Three times a day (TID) | ORAL | Status: DC | PRN

## 2018-11-21 MED ORDER — SERTRALINE HCL 50 MG PO TABS
50.0000 mg | ORAL_TABLET | Freq: Every day | ORAL | Status: DC
  Administered 2018-11-22 – 2018-11-24 (×3): 50 mg via ORAL
  Filled 2018-11-21 (×3): qty 1

## 2018-11-21 MED ORDER — PROPOFOL 500 MG/50ML IV EMUL
INTRAVENOUS | Status: AC
  Filled 2018-11-21: qty 50

## 2018-11-21 MED ORDER — CHLORHEXIDINE GLUCONATE 4 % EX LIQD: 60.0000 mL | Freq: Once | CUTANEOUS | Status: DC

## 2018-11-21 MED ORDER — ONDANSETRON HCL 4 MG PO TABS: 4.0000 mg | ORAL_TABLET | Freq: Four times a day (QID) | ORAL | Status: DC | PRN

## 2018-11-21 MED ORDER — POTASSIUM 99 MG PO TABS: 99.0000 mg | ORAL_TABLET | Freq: Every day | ORAL | Status: DC

## 2018-11-21 MED ORDER — FAMOTIDINE 20 MG PO TABS
ORAL_TABLET | ORAL | Status: AC
  Administered 2018-11-21: 20 mg via ORAL
  Filled 2018-11-21: qty 1

## 2018-11-21 MED ORDER — ONDANSETRON HCL 4 MG/2ML IJ SOLN: 4.0000 mg | Freq: Once | INTRAMUSCULAR | Status: DC | PRN

## 2018-11-21 MED ORDER — SODIUM CHLORIDE FLUSH 0.9 % IV SOLN
INTRAVENOUS | Status: AC
  Filled 2018-11-21: qty 10

## 2018-11-21 MED ORDER — BUPIVACAINE LIPOSOME 1.3 % IJ SUSP: 20.0000 mL | Freq: Once | INTRAMUSCULAR | Status: DC

## 2018-11-21 MED ORDER — MORPHINE SULFATE (PF) 2 MG/ML IV SOLN
0.5000 mg | INTRAVENOUS | Status: DC | PRN
  Administered 2018-11-21: 1 mg via INTRAVENOUS
  Filled 2018-11-21: qty 1

## 2018-11-21 MED ORDER — ALBUTEROL SULFATE (2.5 MG/3ML) 0.083% IN NEBU: 2.5000 mg | INHALATION_SOLUTION | Freq: Four times a day (QID) | RESPIRATORY_TRACT | Status: DC | PRN

## 2018-11-21 MED ORDER — ACETAMINOPHEN 500 MG PO TABS
ORAL_TABLET | ORAL | Status: AC
  Administered 2018-11-21: 1000 mg via ORAL
  Filled 2018-11-21: qty 2

## 2018-11-21 MED ORDER — SODIUM CHLORIDE 0.9 % IR SOLN
Status: DC | PRN
  Administered 2018-11-21: 1200 mL

## 2018-11-21 MED ORDER — METOCLOPRAMIDE HCL 5 MG/ML IJ SOLN: 5.0000 mg | Freq: Three times a day (TID) | INTRAMUSCULAR | Status: DC | PRN

## 2018-11-21 MED ORDER — SODIUM CHLORIDE 0.9 % IV SOLN
INTRAVENOUS | Status: DC | PRN
  Administered 2018-11-21: 60 mL

## 2018-11-21 MED ORDER — ALBUTEROL SULFATE HFA 108 (90 BASE) MCG/ACT IN AERS
1.0000 | INHALATION_SPRAY | Freq: Two times a day (BID) | RESPIRATORY_TRACT | Status: DC
  Administered 2018-11-21 – 2018-11-24 (×6): 1 via RESPIRATORY_TRACT
  Filled 2018-11-21: qty 6.7

## 2018-11-21 MED ORDER — VITAMIN B-12 1000 MCG PO TABS
1000.0000 ug | ORAL_TABLET | Freq: Every day | ORAL | Status: DC
  Administered 2018-11-22 – 2018-11-24 (×3): 1000 ug via ORAL
  Filled 2018-11-21 (×3): qty 1

## 2018-11-21 MED ORDER — FENTANYL CITRATE (PF) 100 MCG/2ML IJ SOLN: 25.0000 ug | INTRAMUSCULAR | Status: DC | PRN

## 2018-11-21 MED ORDER — LACTATED RINGERS IV SOLN
INTRAVENOUS | Status: DC
  Administered 2018-11-21: 14:00:00 via INTRAVENOUS

## 2018-11-21 MED ORDER — ASPIRIN 81 MG PO CHEW
81.0000 mg | CHEWABLE_TABLET | Freq: Two times a day (BID) | ORAL | Status: DC
  Administered 2018-11-21 – 2018-11-24 (×6): 81 mg via ORAL
  Filled 2018-11-21 (×6): qty 1

## 2018-11-21 MED ORDER — BUPIVACAINE-EPINEPHRINE (PF) 0.5% -1:200000 IJ SOLN
INTRAMUSCULAR | Status: DC | PRN
  Administered 2018-11-21: 30 mL

## 2018-11-21 MED ORDER — ALPRAZOLAM 0.25 MG PO TABS: 0.2500 mg | ORAL_TABLET | Freq: Every day | ORAL | Status: DC | PRN

## 2018-11-21 MED ORDER — BUPIVACAINE HCL (PF) 0.5 % IJ SOLN
INTRAMUSCULAR | Status: DC | PRN
  Administered 2018-11-21: 3 mL

## 2018-11-21 MED ORDER — CLINDAMYCIN PHOSPHATE 600 MG/50ML IV SOLN
600.0000 mg | Freq: Four times a day (QID) | INTRAVENOUS | Status: AC
  Administered 2018-11-21 – 2018-11-22 (×2): 600 mg via INTRAVENOUS
  Filled 2018-11-21 (×2): qty 50

## 2018-11-21 MED ORDER — POTASSIUM CHLORIDE CRYS ER 10 MEQ PO TBCR
10.0000 meq | EXTENDED_RELEASE_TABLET | Freq: Every day | ORAL | Status: DC
  Administered 2018-11-22 – 2018-11-24 (×3): 10 meq via ORAL
  Filled 2018-11-21 (×3): qty 1

## 2018-11-21 MED ORDER — BACITRACIN 50000 UNITS IM SOLR
INTRAMUSCULAR | Status: AC
  Filled 2018-11-21: qty 1

## 2018-11-21 MED ORDER — ROPIVACAINE HCL 5 MG/ML IJ SOLN
INTRAMUSCULAR | Status: AC
  Filled 2018-11-21: qty 30

## 2018-11-21 MED ORDER — MONTELUKAST SODIUM 10 MG PO TABS
10.0000 mg | ORAL_TABLET | Freq: Every day | ORAL | Status: DC
  Administered 2018-11-21 – 2018-11-23 (×3): 10 mg via ORAL
  Filled 2018-11-21 (×3): qty 1

## 2018-11-21 MED ORDER — FAMOTIDINE 20 MG PO TABS
20.0000 mg | ORAL_TABLET | Freq: Once | ORAL | Status: AC
  Administered 2018-11-21: 20 mg via ORAL

## 2018-11-21 MED ORDER — ZOLPIDEM TARTRATE 5 MG PO TABS
5.0000 mg | ORAL_TABLET | Freq: Every day | ORAL | Status: DC
  Administered 2018-11-22 – 2018-11-23 (×3): 5 mg via ORAL
  Filled 2018-11-21 (×4): qty 1

## 2018-11-21 MED ORDER — CLINDAMYCIN PHOSPHATE 900 MG/50ML IV SOLN
INTRAVENOUS | Status: AC
  Filled 2018-11-21: qty 50

## 2018-11-21 MED ORDER — FENTANYL CITRATE (PF) 100 MCG/2ML IJ SOLN
50.0000 ug | Freq: Once | INTRAMUSCULAR | Status: AC
  Administered 2018-11-21: 50 ug via INTRAVENOUS

## 2018-11-21 MED ORDER — BISACODYL 10 MG RE SUPP
10.0000 mg | Freq: Every day | RECTAL | Status: DC | PRN
  Administered 2018-11-24: 10 mg via RECTAL
  Filled 2018-11-21: qty 1

## 2018-11-21 MED ORDER — SEVOFLURANE IN SOLN
RESPIRATORY_TRACT | Status: AC
  Filled 2018-11-21: qty 250

## 2018-11-21 MED ORDER — PHENOL 1.4 % MT LIQD
1.0000 | OROMUCOSAL | Status: DC | PRN
  Filled 2018-11-21: qty 177

## 2018-11-21 MED ORDER — MIDAZOLAM HCL 2 MG/2ML IJ SOLN
INTRAMUSCULAR | Status: AC
  Administered 2018-11-21: 1 mg via INTRAVENOUS
  Filled 2018-11-21: qty 2

## 2018-11-21 MED ORDER — SODIUM CHLORIDE 0.9 % IV SOLN
INTRAVENOUS | Status: DC | PRN
  Administered 2018-11-21: 40 ug/min via INTRAVENOUS

## 2018-11-21 MED ORDER — DOCUSATE SODIUM 100 MG PO CAPS
100.0000 mg | ORAL_CAPSULE | Freq: Two times a day (BID) | ORAL | Status: DC
  Administered 2018-11-21 – 2018-11-23 (×5): 100 mg via ORAL
  Filled 2018-11-21 (×6): qty 1

## 2018-11-21 MED ORDER — BUPIVACAINE HCL (PF) 0.5 % IJ SOLN
INTRAMUSCULAR | Status: AC
  Filled 2018-11-21: qty 10

## 2018-11-21 MED ORDER — MAGNESIUM HYDROXIDE 400 MG/5ML PO SUSP
30.0000 mL | Freq: Every day | ORAL | Status: DC | PRN
  Administered 2018-11-23: 30 mL via ORAL
  Filled 2018-11-21: qty 30

## 2018-11-21 MED ORDER — FUROSEMIDE 40 MG PO TABS: 40.0000 mg | ORAL_TABLET | Freq: Every day | ORAL | Status: DC | PRN

## 2018-11-21 MED ORDER — ACETAMINOPHEN 500 MG PO TABS
500.0000 mg | ORAL_TABLET | Freq: Four times a day (QID) | ORAL | Status: AC
  Administered 2018-11-21 – 2018-11-22 (×4): 500 mg via ORAL
  Filled 2018-11-21 (×4): qty 1

## 2018-11-21 MED ORDER — KETOROLAC TROMETHAMINE 15 MG/ML IJ SOLN
7.5000 mg | Freq: Four times a day (QID) | INTRAMUSCULAR | Status: AC
  Administered 2018-11-21 – 2018-11-22 (×4): 7.5 mg via INTRAVENOUS
  Filled 2018-11-21 (×4): qty 1

## 2018-11-21 MED ORDER — ROPIVACAINE HCL 5 MG/ML IJ SOLN
INTRAMUSCULAR | Status: DC | PRN
  Administered 2018-11-21: 30 mL via PERINEURAL

## 2018-11-21 MED ORDER — ACETAMINOPHEN 325 MG PO TABS
325.0000 mg | ORAL_TABLET | Freq: Four times a day (QID) | ORAL | Status: DC | PRN
  Administered 2018-11-23: 650 mg via ORAL
  Filled 2018-11-21: qty 2

## 2018-11-21 MED ORDER — PANTOPRAZOLE SODIUM 40 MG PO TBEC
40.0000 mg | DELAYED_RELEASE_TABLET | Freq: Every day | ORAL | Status: DC
  Administered 2018-11-22 – 2018-11-24 (×3): 40 mg via ORAL
  Filled 2018-11-21 (×3): qty 1

## 2018-11-21 MED ORDER — ONDANSETRON HCL 4 MG/2ML IJ SOLN
4.0000 mg | Freq: Four times a day (QID) | INTRAMUSCULAR | Status: DC | PRN
  Administered 2018-11-22: 4 mg via INTRAVENOUS
  Filled 2018-11-21: qty 2

## 2018-11-21 MED ORDER — BUPIVACAINE LIPOSOME 1.3 % IJ SUSP
INTRAMUSCULAR | Status: AC
  Filled 2018-11-21: qty 20

## 2018-11-21 MED ORDER — BUPIVACAINE-EPINEPHRINE (PF) 0.5% -1:200000 IJ SOLN
INTRAMUSCULAR | Status: AC
  Filled 2018-11-21: qty 30

## 2018-11-21 MED ORDER — LIDOCAINE HCL (PF) 1 % IJ SOLN
INTRAMUSCULAR | Status: DC | PRN
  Administered 2018-11-21: 5 mL via SUBCUTANEOUS

## 2018-11-21 MED ORDER — MENTHOL 3 MG MT LOZG
1.0000 | LOZENGE | OROMUCOSAL | Status: DC | PRN
  Filled 2018-11-21: qty 9

## 2018-11-21 MED ORDER — FENTANYL CITRATE (PF) 100 MCG/2ML IJ SOLN
INTRAMUSCULAR | Status: AC
  Filled 2018-11-21: qty 2

## 2018-11-21 SURGICAL SUPPLY — 61 items
ARTICULAR INSERT ×3 IMPLANT
BASEPLATE TIBIAL SZ 3 (Knees) ×1 IMPLANT
BLADE SAW 18WX90L 1.27 THK (BLADE) ×3 IMPLANT
BLADE SAW SAG 25X90X1.19 (BLADE) ×3 IMPLANT
BOWL CEMENT MIX W/ADAPTER (MISCELLANEOUS) ×3 IMPLANT
BRUSH SCRUB EZ  4% CHG (MISCELLANEOUS) ×4
BRUSH SCRUB EZ 4% CHG (MISCELLANEOUS) ×2 IMPLANT
CANISTER SUCT 1200ML W/VALVE (MISCELLANEOUS) ×3 IMPLANT
CANISTER SUCT 3000ML PPV (MISCELLANEOUS) ×6 IMPLANT
CEMENT BONE 1-PACK (Cement) ×6 IMPLANT
CHLORAPREP W/TINT 26 (MISCELLANEOUS) ×6 IMPLANT
COMP PATELLA GENESIS 29 OVAL (Orthopedic Implant) ×3 IMPLANT
COMPONENT PTLLA GENS 29 OVAL (Orthopedic Implant) ×1 IMPLANT
COOLER POLAR GLACIER W/PUMP (MISCELLANEOUS) ×3 IMPLANT
COVER WAND RF STERILE (DRAPES) ×3 IMPLANT
CUFF TOURN SGL QUICK 30 (TOURNIQUET CUFF) ×2
CUFF TRNQT CYL 30X4X21-28X (TOURNIQUET CUFF) ×1 IMPLANT
DRAPE INCISE IOBAN 66X60 STRL (DRAPES) ×3 IMPLANT
DRAPE SHEET LG 3/4 BI-LAMINATE (DRAPES) ×6 IMPLANT
ELECT REM PT RETURN 9FT ADLT (ELECTROSURGICAL) ×3
ELECTRODE REM PT RTRN 9FT ADLT (ELECTROSURGICAL) ×1 IMPLANT
FEM COMP OXINIUM 5N RT NARROW (Orthopedic Implant) ×3 IMPLANT
FEMORAL COMP OXINIUM 5N RT NRW (Orthopedic Implant) ×1 IMPLANT
GAUZE PETRO XEROFOAM 1X8 (MISCELLANEOUS) ×3 IMPLANT
GAUZE SPONGE 4X4 12PLY STRL (GAUZE/BANDAGES/DRESSINGS) ×3 IMPLANT
GLOVE BIO SURGEON STRL SZ8 (GLOVE) ×3 IMPLANT
GLOVE BIOGEL PI IND STRL 8.5 (GLOVE) ×1 IMPLANT
GLOVE BIOGEL PI INDICATOR 8.5 (GLOVE) ×2
GLOVE INDICATOR 8.0 STRL GRN (GLOVE) ×3 IMPLANT
GLOVE SURG ORTHO 8.0 STRL STRW (GLOVE) ×9 IMPLANT
GOWN STRL REUS W/ TWL LRG LVL3 (GOWN DISPOSABLE) ×1 IMPLANT
GOWN STRL REUS W/ TWL XL LVL3 (GOWN DISPOSABLE) ×1 IMPLANT
GOWN STRL REUS W/TWL LRG LVL3 (GOWN DISPOSABLE) ×2
GOWN STRL REUS W/TWL XL LVL3 (GOWN DISPOSABLE) ×2
HOOD PEEL AWAY FLYTE STAYCOOL (MISCELLANEOUS) ×9 IMPLANT
IV NS 1000ML (IV SOLUTION) ×2
IV NS 1000ML BAXH (IV SOLUTION) ×1 IMPLANT
KIT TURNOVER KIT A (KITS) ×3 IMPLANT
MAT ABSORB  FLUID 56X50 GRAY (MISCELLANEOUS)
MAT ABSORB FLUID 56X50 GRAY (MISCELLANEOUS) IMPLANT
NDL SAFETY ECLIPSE 18X1.5 (NEEDLE) ×1 IMPLANT
NEEDLE HYPO 18GX1.5 SHARP (NEEDLE) ×2
NEEDLE SPNL 20GX3.5 QUINCKE YW (NEEDLE) ×3 IMPLANT
NS IRRIG 1000ML POUR BTL (IV SOLUTION) ×3 IMPLANT
PACK TOTAL KNEE (MISCELLANEOUS) ×3 IMPLANT
PAD DE MAYO PRESSURE PROTECT (MISCELLANEOUS) ×3 IMPLANT
PAD WRAPON POLAR KNEE (MISCELLANEOUS) ×1 IMPLANT
PULSAVAC PLUS IRRIG FAN TIP (DISPOSABLE) ×3
STAPLER SKIN PROX 35W (STAPLE) ×3 IMPLANT
SUCTION FRAZIER HANDLE 10FR (MISCELLANEOUS) ×2
SUCTION TUBE FRAZIER 10FR DISP (MISCELLANEOUS) ×1 IMPLANT
SUT DVC 2 QUILL PDO  T11 36X36 (SUTURE) ×2
SUT DVC 2 QUILL PDO T11 36X36 (SUTURE) ×1 IMPLANT
SUT VIC AB 2-0 CT1 18 (SUTURE) ×3 IMPLANT
SUT VIC AB 2-0 CT1 27 (SUTURE)
SUT VIC AB 2-0 CT1 TAPERPNT 27 (SUTURE) IMPLANT
SUT VIC AB PLUS 45CM 1-MO-4 (SUTURE) ×3 IMPLANT
SYR 30ML LL (SYRINGE) ×9 IMPLANT
TIBIAL BASEPLATE SZ 3 (Knees) ×3 IMPLANT
TIP FAN IRRIG PULSAVAC PLUS (DISPOSABLE) ×1 IMPLANT
WRAPON POLAR PAD KNEE (MISCELLANEOUS) ×3

## 2018-11-21 NOTE — Anesthesia Post-op Follow-up Note (Signed)
Anesthesia QCDR form completed.        

## 2018-11-21 NOTE — H&P (Signed)
The patient has been re-examined, and the chart reviewed, and there have been no interval changes to the documented history and physical.  Plan a right total knee today.  Anesthesia is consulted regarding a peripheral nerve block for post-operative pain.  The risks, benefits, and alternatives have been discussed at length, and the patient is willing to proceed.     

## 2018-11-21 NOTE — Transfer of Care (Signed)
Immediate Anesthesia Transfer of Care Note  Patient: Jo Malone  Procedure(s) Performed: TOTAL KNEE ARTHROPLASTY-RIGHT (Right )  Patient Location: PACU  Anesthesia Type:Spinal  Level of Consciousness: awake, alert  and oriented  Airway & Oxygen Therapy: Patient Spontanous Breathing and Patient connected to face mask oxygen  Post-op Assessment: Report given to RN and Post -op Vital signs reviewed and stable  Post vital signs: Reviewed and stable  Last Vitals:  Vitals Value Taken Time  BP    Temp    Pulse 73 11/21/2018  3:56 PM  Resp 15 11/21/2018  3:56 PM  SpO2 98 % 11/21/2018  3:56 PM  Vitals shown include unvalidated device data.  Last Pain:  Vitals:   11/21/18 1359  TempSrc:   PainSc: 0-No pain         Complications: No apparent anesthesia complications

## 2018-11-21 NOTE — Anesthesia Preprocedure Evaluation (Signed)
Anesthesia Evaluation  Patient identified by MRN, date of birth, ID band Patient awake    Reviewed: Allergy & Precautions, NPO status , Patient's Chart, lab work & pertinent test results  History of Anesthesia Complications Negative for: history of anesthetic complications  Airway Mallampati: II  TM Distance: >3 FB     Dental  (+) Caps, Dental Advidsory Given, Teeth Intact   Pulmonary neg shortness of breath, asthma , neg sleep apnea, neg COPD, neg recent URI,    Pulmonary exam normal        Cardiovascular Exercise Tolerance: Good negative cardio ROS Normal cardiovascular exam     Neuro/Psych neg Seizures Anxiety  Neuromuscular disease    GI/Hepatic Neg liver ROS, hiatal hernia, GERD  ,  Endo/Other  negative endocrine ROS  Renal/GU negative Renal ROS     Musculoskeletal  (+) Arthritis , Osteoarthritis,    Abdominal Normal abdominal exam  (+)   Peds negative pediatric ROS (+)  Hematology negative hematology ROS (+)   Anesthesia Other Findings Past Medical History: No date: Anxiety No date: Arthritis No date: Asthma No date: Complication of anesthesia     Comment:  Difficulty with intubation and cancelled surgery No date: GERD (gastroesophageal reflux disease) No date: History of hiatal hernia No date: Neuropathy   Reproductive/Obstetrics                             Anesthesia Physical  Anesthesia Plan  ASA: II  Anesthesia Plan: Spinal   Post-op Pain Management:  Regional for Post-op pain   Induction:   PONV Risk Score and Plan: 2 and Propofol infusion and TIVA  Airway Management Planned: Simple Face Mask and Natural Airway  Additional Equipment:   Intra-op Plan:   Post-operative Plan:   Informed Consent: I have reviewed the patients History and Physical, chart, labs and discussed the procedure including the risks, benefits and alternatives for the proposed anesthesia  with the patient or authorized representative who has indicated his/her understanding and acceptance.     Dental advisory given  Plan Discussed with: CRNA and Surgeon  Anesthesia Plan Comments:         Anesthesia Quick Evaluation

## 2018-11-21 NOTE — Anesthesia Procedure Notes (Signed)
Spinal  Patient location during procedure: OR Start time: 11/21/2018 2:06 PM End time: 11/21/2018 2:15 PM Staffing Resident/CRNA: Nelda Marseille, CRNA Performed: resident/CRNA  Preanesthetic Checklist Completed: patient identified, site marked, surgical consent, pre-op evaluation, timeout performed, IV checked, risks and benefits discussed and monitors and equipment checked Spinal Block Patient position: sitting Prep: Betadine Patient monitoring: heart rate, continuous pulse ox, blood pressure and cardiac monitor Approach: midline Location: L3-4 Injection technique: single-shot Needle Needle type: Whitacre and Introducer  Needle gauge: 25 G Needle length: 9 cm Assessment Sensory level: T10 Additional Notes Negative paresthesia. Negative blood return. Positive free-flowing CSF. Expiration date of kit checked and confirmed. Patient tolerated procedure well, without complications.

## 2018-11-21 NOTE — Anesthesia Procedure Notes (Signed)
Date/Time: 11/21/2018 2:42 PM Performed by: Nelda Marseille, CRNA Pre-anesthesia Checklist: Patient identified, Emergency Drugs available, Suction available, Patient being monitored and Timeout performed Oxygen Delivery Method: Simple face mask

## 2018-11-21 NOTE — Op Note (Signed)
DATE OF SURGERY:  11/21/2018 TIME: 4:04 PM  PATIENT NAME:  Jo Malone   AGE: 83 y.o.    PRE-OPERATIVE DIAGNOSIS:  RIGHT KNEE OSTEOARTHRITIS  POST-OPERATIVE DIAGNOSIS:  Same  PROCEDURE:  Procedure(s): TOTAL KNEE ARTHROPLASTY-RIGHT,   SURGEON:  Lovell Sheehan, MD   ASSISTANT:  Carlynn Spry, PA-C  OPERATIVE IMPLANTS: Tamala [employer]hew, Cruciate Retaining Oxinium Femoral component size  5, Fixed Bearing Tray size 3, Patella polyethylene 3-peg oval button size 29 mm, with a 10 mm HighFlex insert.   PREOPERATIVE INDICATIONS:  Jo Malone is an 83 y.o. female who has a diagnosis of RIGHT KNEE OSTEOARTHRITIS and elected for a right total knee arthroplasty after failing nonoperative treatment, including activity modification, pain medication, physical therapy and injections who has significant impairment of their activities of daily living.  Radiographs have demonstrated tricompartmental osteoarthritis joint space narrowing, osteophytes, subchondral sclerosis and cyst formation.  The risks, benefits, and alternatives were discussed at length including but not limited to the risks of infection, bleeding, nerve or blood vessel injury, knee stiffness, fracture, dislocation, loosening or failure of the hardware and the need for further surgery. Medical risks include but not limited to DVT and pulmonary embolism, myocardial infarction, stroke, pneumonia, respiratory failure and death. I discussed these risks with the patient in my office prior to the date of surgery. They understood these risks and were willing to proceed.  OPERATIVE FINDINGS AND UNIQUE ASPECTS OF THE CASE:  All three compartments with advanced and severe degenerative changes, large osteophytes and an abundance of synovial fluid. Significant deformity was also noted. A decision was made to proceed with total knee arthroplasty.   OPERATIVE DESCRIPTION:  The patient was brought to the operative room and placed in a supine  position after undergoing placement of a general anesthetic. IV antibiotics were given. Patient received tranexamic acid. The lower extremity was prepped and draped in the usual sterile fashion.  A time out was performed to verify the patient's name, date of birth, medical record number, correct site of surgery and correct procedure to be performed. The timeout was also used to confirm the patient received antibiotics and that appropriate instruments, implants and radiographs studies were available in the room.  The leg was elevated and exsanguinated with an Esmarch and the tourniquet was inflated to 250 mmHg.  A midline incision was made over the left knee.. A medial parapatellar arthrotomy was then made and the patella subluxed laterally and the knee was brought into 90 of flexion. Hoffa's fat pad along with the anterior cruciate ligament was resected and the medial joint line was exposed.  Attention was then turned to preparation of the patella. The thickness of the patella was measured with a caliper, the diameter measured with the patella templates.  The patella resection was then made with an oscillating saw using the patella cutting guide.  The 29 mm button fit appropriately.  3 peg holes for the patella component were then drilled.  The extramedullary tibial cutting guide was then placed using the anterior tibial crest and second ray of the foot as a reference.  The tibial cutting guide was adjusted to allow for appropriate posterior slope.  The tibial cutting block was pinned into position. The slotted stylus was used to measure the proximal tibial resection of 9 mm off the high lateral side. Care was taken during the tibial resection to protect the medial and collateral ligaments.  The resected tibial bone was removed.  The distal femur was resected  using the Visionaire cutting guide.  Care was taken to protect the collateral ligaments during distal femoral resection.  The distal femoral resection  was performed with an oscillating saw. The femoral cutting guide was then removed. Extension gap was measured with a 10 mm spacer block and alignment and extension was confirmed using a long alignment rod. The femur was sized to be a 5 narrow. Rotation of the referencing guide was checked with the epicondylar axis and Whitesides line. Then the 4-in-1 cutting jig was then applied to the distal femur. A stylus was used to confirm that the anterior femur would not be notched.   Then the anterior, posterior and chamfer femoral cuts were then made with an oscillating saw.  The knee was distracted and all posterior osteophytes were removed.  The flexion gap was then measured with a flexion spacer block and long alignment rod and was found to be symmetric with the extension gap and perpendicular to mechanical axis of the tibia.  The proximal tibia plateau was then sized with trial trays. The best coverage was achieved with a size 3. This tibial tray was then pinned into position. The proximal tibia was then prepared with the keel punch.  After tibial preparation was completed, all trial components were inserted with polyethylene trials. The knee achieved full extension and flexed to 120 degrees. Ligament were stable to varus and valgus at full extension as well as 30, 60 and 90 degrees of flexion.   The trials were then placed. Knee was taken through a full range of motion and deemed to be stable with the trial components. All trial components were then removed.  The joint was copiously irrigated with pulse lavage.  The final total knee arthroplasty components were then cemented into place. The knee was held in extension while cement was allowed to cure.The knee was taken through a range of motion and the patella tracked well and the knee was again irrigated copiously.  The knee capsule was then injected with Exparel.  The medial arthrotomy was closed with #1 Vicryl and #2 Quill. The subcutaneous tissue closed with   2-0 vicryl, and skin approximated with staples.  A dry sterile and compressive dressing was applied.  A Polar Care was applied to the operative knee.  The patient was awakened and brought to the PACU in stable and satisfactory condition.  All sharp, lap and instrument counts were correct at the conclusion the case. I spoke with the patient's family in the postop consultation room to let them know the case had been performed without complication and the patient was stable in recovery room.   Total tourniquet time was 40 minutes.

## 2018-11-21 NOTE — Anesthesia Postprocedure Evaluation (Signed)
Anesthesia Post Note  Patient: Jo Malone  Procedure(s) Performed: TOTAL KNEE ARTHROPLASTY-RIGHT (Right )  Patient location during evaluation: PACU Anesthesia Type: Spinal Level of consciousness: awake and alert Pain management: pain level controlled Vital Signs Assessment: post-procedure vital signs reviewed and stable Respiratory status: spontaneous breathing and respiratory function stable Cardiovascular status: stable Anesthetic complications: no     Last Vitals:  Vitals:   11/21/18 1747 11/21/18 1816  BP: 132/70 (!) 158/67  Pulse: 62 65  Resp:  18  Temp: (!) 36.4 C 36.4 C  SpO2: 99% 100%    Last Pain:  Vitals:   11/21/18 1825  TempSrc:   PainSc: 4                  KEPHART,WILLIAM K

## 2018-11-21 NOTE — Anesthesia Procedure Notes (Signed)
Anesthesia Regional Block: Adductor canal block   Pre-Anesthetic Checklist: ,, timeout performed, Correct Patient, Correct Site, Correct Laterality, Correct Procedure, Correct Position, site marked, Risks and benefits discussed,  Surgical consent,  Pre-op evaluation,  At surgeon's request and post-op pain management  Laterality: Lower and Right  Prep: chloraprep       Needles:  Injection technique: Single-shot  Needle Type: Echogenic Needle     Needle Length: 9cm  Needle Gauge: 21     Additional Needles:   Procedures:,,,, ultrasound used (permanent image in chart),,,,  Narrative:  Start time: 11/21/2018 1:55 PM End time: 11/21/2018 2:00 PM Injection made incrementally with aspirations every 5 mL.  Performed by: Personally  Anesthesiologist: Piscitello, Precious Haws, MD  Additional Notes: Patient consented for risk and benefits of nerve block including but not limited to nerve damage, failed block, bleeding and infection.  Patient voiced understanding.  Functioning IV was confirmed and monitors were applied.  A echogenic needle was used. Sterile prep,hand hygiene and sterile gloves were used. Minimal sedation used for procedure.   No paresthesia endorsed by patient during the procedure.  Negative aspiration and negative test dose prior to incremental administration of local anesthetic. The patient tolerated the procedure well with no immediate complications.

## 2018-11-22 ENCOUNTER — Encounter: Payer: Self-pay | Admitting: Orthopedic Surgery

## 2018-11-22 DIAGNOSIS — M1711 Unilateral primary osteoarthritis, right knee: Secondary | ICD-10-CM | POA: Diagnosis not present

## 2018-11-22 LAB — BASIC METABOLIC PANEL
Anion gap: 7 (ref 5–15)
BUN: 15 mg/dL (ref 8–23)
CALCIUM: 8.3 mg/dL — AB (ref 8.9–10.3)
CO2: 26 mmol/L (ref 22–32)
Chloride: 108 mmol/L (ref 98–111)
Creatinine, Ser: 0.9 mg/dL (ref 0.44–1.00)
GFR calc Af Amer: >60 mL/min (ref >60)
GFR calc non Af Amer: >60 mL/min (ref >60)
Glucose, Bld: 101 mg/dL — ABNORMAL HIGH (ref 70–99)
Potassium: 3.6 mmol/L (ref 3.5–5.1)
Sodium: 141 mmol/L (ref 135–145)

## 2018-11-22 LAB — CBC
HCT: 33.3 % — ABNORMAL LOW (ref 36.0–46.0)
HEMOGLOBIN: 10.3 g/dL — AB (ref 12.0–15.0)
MCH: 27.8 pg (ref 26.0–34.0)
MCHC: 30.9 g/dL (ref 30.0–36.0)
MCV: 90 fL (ref 80.0–100.0)
Platelets: 196 10*3/uL (ref 150–400)
RBC: 3.7 MIL/uL — ABNORMAL LOW (ref 3.87–5.11)
RDW: 13.9 % (ref 11.5–15.5)
WBC: 9.5 10*3/uL (ref 4.0–10.5)
nRBC: 0 % (ref 0.0–0.2)

## 2018-11-22 NOTE — Care Management CC44 (Signed)
Condition Code 44 Documentation Completed  Patient Details  Name: Jo Malone MRN: [id number] Date of Birth: July 04, 1936   Condition Code 44 given:  Yes Patient signature on Condition Code 44 notice:  Yes Documentation of 2 MD's agreement:  Yes Code 44 added to claim:  Yes    Su Hilt, RN 11/22/2018, 10:42 AM

## 2018-11-22 NOTE — Progress Notes (Signed)
Physical Therapy Treatment Patient Details Name: Jo Malone MRN: [id number] DOB: Nov 14, 1935 Today's Date: 11/22/2018    History of Present Illness Pt is an 83 yo female s/p elective R TKA.  PMH includes: L TKA, neuropathy OA, and hiatal hernia.      PT Comments    Pt presents with deficits in strength, transfers, mobility, gait, balance, R knee ROM, and activity tolerance but made good progress towards goals this session.  Pt was Mod Ind with bed mobility tasks and CGA with transfers.  Pt's BP taken in sitting at 107/48 and then in standing at 103/49 with no symptoms reported.  Pt was able to amb 1 x 60' and 1 x 125' with a RW and CGA with good stability and without R knee buckling.  Pt will benefit from HHPT services upon discharge to safely address above deficits for decreased caregiver assistance and eventual return to PLOF.     Follow Up Recommendations  Home health PT;Supervision for mobility/OOB     Equipment Recommendations  None recommended by PT    Recommendations for Other Services       Precautions / Restrictions Precautions Precautions: Knee Restrictions Weight Bearing Restrictions: Yes RLE Weight Bearing: Weight bearing as tolerated    Mobility  Bed Mobility Overal bed mobility: Modified Independent             General bed mobility comments: Extra time and effort with bed mobility tasks but no physical assistance required  Transfers Overall transfer level: Needs assistance Equipment used: Rolling walker (2 wheeled) Transfers: Sit to/from Stand Sit to Stand: Min guard         General transfer comment: Min verbal cues for sequencing   Ambulation/Gait Ambulation/Gait assistance: Min guard Gait Distance (Feet): 125 Feet x 1, 60 Feet x 1 Assistive device: Rolling walker (2 wheeled) Gait Pattern/deviations: Step-through pattern;Antalgic;Decreased stance time - right;Decreased step length - left Gait velocity: decreased   General Gait Details:  Slow cadence but with deceased lean on the RW this session.  Pt steady without LOB or R knee buckling with no adverse symptoms this session.  Pt required mod verbal cues for upright posture and amb closer to the RW and to stay within the RW during turns for general safety.     Stairs             Wheelchair Mobility    Modified Rankin (Stroke Patients Only)       Balance Overall balance assessment: Needs assistance   Sitting balance-Leahy Scale: Good     Standing balance support: Bilateral upper extremity supported Standing balance-Leahy Scale: Fair Standing balance comment: Heavy lean on the RW for support at times                            Cognition Arousal/Alertness: Awake/alert Behavior During Therapy: WFL for tasks assessed/performed Overall Cognitive Status: Within Functional Limits for tasks assessed                                        Exercises Total Joint Exercises Ankle Circles/Pumps: AROM;Both;10 reps;5 reps Quad Sets: Strengthening;AROM;Both;10 reps;15 reps Gluteal Sets: Strengthening;Both;10 reps;5 reps Hip ABduction/ADduction: AROM;Both;AAROM;10 reps Straight Leg Raises: AROM;AAROM;Both;10 reps Long Arc Quad: AROM;Strengthening;Right;10 reps;15 reps Knee Flexion: AROM;Right;10 reps;15 reps;Strengthening Goniometric ROM: R knee AROM: 8-67 deg Marching in Standing: AROM;Both;5 reps;Standing Other Exercises Other Exercises:  HEP education/review per handout Other Exercises: Simulated car transfer sequencing education using a room chair for demonstration    General Comments        Pertinent Vitals/Pain Pain Assessment: 0-10 Pain Score: 6  Pain Location: R knee Pain Descriptors / Indicators: Aching;Sore Pain Intervention(s): Premedicated before session;Monitored during session    [name] expects to be discharged to:: Private residence Living Arrangements: Alone Available Help at Discharge:  Family;Available PRN/intermittently;Other (Comment)(Daughter lives next to pt) Type of Home: Apartment Home Access: Stairs to enter   Home Layout: One level Home Equipment: Bedside commode;Walker - 2 wheels;Shower seat - built in      Prior Function        Comments: Pt Ind with amb without an AD community distances, [place], no fall history, Ind with ADLs   PT Goals (current goals can now be found in the care plan section) Acute Rehab PT Goals Patient Stated Goal: To walk better and get stronger PT Goal Formulation: With patient Time For Goal Achievement: 12/05/18 Potential to Achieve Goals: Good Progress towards PT goals: Progressing toward goals    Frequency    BID      PT Plan Current plan remains appropriate    Co-evaluation              AM-PAC PT "6 Clicks" Mobility   Outcome Measure  Help needed turning from your back to your side while in a flat bed without using bedrails?: A Little Help needed moving from lying on your back to sitting on the side of a flat bed without using bedrails?: A Little Help needed moving to and from a bed to a chair (including a wheelchair)?: A Little Help needed standing up from a chair using your arms (e.g., wheelchair or bedside chair)?: A Little Help needed to walk in hospital room?: A Little Help needed climbing 3-5 steps with a railing? : A Little 6 Click Score: 18    End of Session Equipment Utilized During Treatment: Gait belt Activity Tolerance: Patient tolerated treatment well;No increased pain Patient left: with call bell/phone within reach;with SCD's reapplied;Other (comment);in bed;with bed alarm set(Polar care donned to R knee) Nurse Communication: Mobility status PT Visit Diagnosis: Muscle weakness (generalized) (M62.81);Other abnormalities of gait and mobility (R26.89);Pain Pain - Right/Left: Right Pain - part of body: Knee     Time: 1326-1406 PT Time Calculation (min) (ACUTE ONLY): 40  min  Charges:  $Gait Training: 8-22 mins $Therapeutic Exercise: 8-22 mins $Therapeutic Activity: 8-22 mins                     D. Scott Jalin Erpelding PT, DPT 11/22/18, 3:38 PM

## 2018-11-22 NOTE — Evaluation (Signed)
Physical Therapy Evaluation Patient Details Name: Jo Malone MRN: [id number] DOB: 09/11/1935 Today's Date: 11/22/2018   History of Present Illness  Pt is an 83 yo female s/p elective R TKA.  PMH includes: L TKA, neuropathy OA, and hiatal hernia.    Clinical Impression  Pt presents with deficits in strength, transfers, mobility, gait, balance, R knee ROM, and activity tolerance.  Pt was Mod Ind with bed mobility tasks and CGA with transfers with cues for sequencing.  Pt was able to amb 60' with a RW with antalgic gait and slow cadence but was steady without LOB.  Upon returning to sitting pt c/o dizziness and nausea with BP taken at 65/43 mmHg.  Nursing contacted and entered the room to assess the pt.  Pt will benefit from HHPT services upon discharge to safely address above deficits for decreased caregiver assistance and eventual return to PLOF.      Follow Up Recommendations Home health PT;Supervision for mobility/OOB    Equipment Recommendations  None recommended by PT    Recommendations for Other Services       Precautions / Restrictions Precautions Precautions: Knee Restrictions Weight Bearing Restrictions: Yes RLE Weight Bearing: Weight bearing as tolerated  Watch BP     Mobility  Bed Mobility Overal bed mobility: Modified Independent             General bed mobility comments: Extra time and effort with bed mobility tasks but no physical assistance required  Transfers Overall transfer level: Needs assistance Equipment used: Rolling walker (2 wheeled) Transfers: Sit to/from Stand Sit to Stand: Min guard         General transfer comment: Mod verbal cues for sequencing   Ambulation/Gait Ambulation/Gait assistance: Min guard Gait Distance (Feet): 60 Feet Assistive device: Rolling walker (2 wheeled) Gait Pattern/deviations: Step-through pattern;Antalgic;Decreased stance time - right;Decreased step length - left Gait velocity: decreased   General Gait  Details: Slow cadence with heavy lean on the RW that improved with cues; steady without LOB or R knee buckling.  BP 65/43 after amb with dizziness.  Stairs            Wheelchair Mobility    Modified Rankin (Stroke Patients Only)       Balance Overall balance assessment: Needs assistance   Sitting balance-Leahy Scale: Good     Standing balance support: Bilateral upper extremity supported Standing balance-Leahy Scale: Fair Standing balance comment: Heavy lean on the RW for support at times                             Pertinent Vitals/Pain Pain Assessment: 0-10 Pain Score: 4  Pain Location: R knee Pain Descriptors / Indicators: Aching;Sore Pain Intervention(s): Patient requesting pain meds-RN notified;RN gave pain meds during session;Monitored during session    [place] expects to be discharged to:: Private residence Living Arrangements: Alone Available Help at Discharge: Family;Available PRN/intermittently;Other (Comment)(Daughter lives next to pt) Type of Home: Apartment Home Access: Stairs to enter   [employer] of Steps: Pt has a chair lift to ascend and descend stairs at home Home Layout: One level Home Equipment: Bedside commode;Walker - 2 wheels;Shower seat - built in      Prior Function           Comments: Pt Ind with amb without an AD community distances, visits her [place] in [place], no fall history, Ind with ADLs     Hand Dominance  Extremity/Trunk Assessment   Upper Extremity Assessment Upper Extremity Assessment: Overall WFL for tasks assessed    Lower Extremity Assessment Lower Extremity Assessment: Generalized weakness;RLE deficits/detail RLE Deficits / Details: R hip flex >/= 3/5 with pt able to perform Ind RLE SLR without extensor lag RLE: Unable to fully assess due to pain       Communication   Communication: No difficulties  Cognition Arousal/Alertness: Awake/alert Behavior During  Therapy: WFL for tasks assessed/performed Overall Cognitive Status: Within Functional Limits for tasks assessed                                        General Comments      Exercises Total Joint Exercises Ankle Circles/Pumps: AROM;Both;10 reps Quad Sets: Strengthening;AROM;Both;10 reps Gluteal Sets: Strengthening;Both;10 reps Hip ABduction/ADduction: AROM;Both;AAROM;10 reps Straight Leg Raises: AROM;AAROM;Both;10 reps Long Arc Quad: AROM;Strengthening;Right;10 reps;15 reps Knee Flexion: AROM;Right;10 reps;15 reps;Strengthening Goniometric ROM: R knee AROM: 8-67 deg Marching in Standing: AROM;Both;5 reps;Standing Other Exercises Other Exercises: HEP education per handout Other Exercises: Positioning education to promote R knee ext PROM   Assessment/Plan    PT Assessment Patient needs continued PT services  PT Problem List Decreased strength;Decreased range of motion;Decreased activity tolerance;Decreased balance;Decreased mobility;Decreased knowledge of use of DME       PT Treatment Interventions DME instruction;Gait training;Functional mobility training;Therapeutic activities;Therapeutic exercise;Balance training;Patient/family education    PT Goals (Current goals can be found in the Care Plan section)  Acute Rehab PT Goals Patient Stated Goal: To walk better and get stronger PT Goal Formulation: With patient Time For Goal Achievement: 12/05/18 Potential to Achieve Goals: Good    Frequency BID   Barriers to discharge        Co-evaluation               AM-PAC PT "6 Clicks" Mobility  Outcome Measure Help needed turning from your back to your side while in a flat bed without using bedrails?: A Little Help needed moving from lying on your back to sitting on the side of a flat bed without using bedrails?: A Little Help needed moving to and from a bed to a chair (including a wheelchair)?: A Little Help needed standing up from a chair using your arms  (e.g., wheelchair or bedside chair)?: A Little Help needed to walk in hospital room?: A Little Help needed climbing 3-5 steps with a railing? : A Lot 6 Click Score: 17    End of Session Equipment Utilized During Treatment: Gait belt Activity Tolerance: Treatment limited secondary to medical complications (Comment);Other (comment)(Pt c/o dizziness after amb with BP 65/43) Patient left: in chair;with nursing/sitter in room;with chair alarm set;with call bell/phone within reach;with SCD's reapplied;Other (comment)(Polar care to R knee; nurse in room assessing pt at end of session) Nurse Communication: Mobility status;Other (comment)(BP response to activity) PT Visit Diagnosis: Muscle weakness (generalized) (M62.81);Other abnormalities of gait and mobility (R26.89);Pain Pain - Right/Left: Right Pain - part of body: Knee    Time: 4496-7591 PT Time Calculation (min) (ACUTE ONLY): 56 min   Charges:   PT Evaluation $PT Eval Low Complexity: 1 Low PT Treatments $Therapeutic Exercise: 8-22 mins $Therapeutic Activity: 8-22 mins        D. Royetta Asal PT, DPT 11/22/18, 12:18 PM

## 2018-11-22 NOTE — TOC Progression Note (Signed)
Transition of Care [facility]) - Progression Note    Patient Details  Name: CARMELINE KOWAL MRN: [id number] Date of Birth: 1936/06/07  [facility]) CM/SW Contact  Su Hilt, RN Phone Number: 11/22/2018, 11:00 AM  Clinical Narrative:     [name] with [facility] emailed and accepted the patient   Expected Discharge Plan: [place] Barriers to Discharge: Continued Medical Work up  Expected Discharge Plan and Services Expected Discharge Plan: [place] Discharge Planning Services: CM Consult Post Acute Care Choice: [place] arrangements for the past 2 months: Apartment                     HH Arranged: PT [place]: [facility])   Social Determinants of Health (SDOH) Interventions    Readmission Risk Interventions 30 Day Unplanned Readmission Risk Score     Admission (Current) from 11/21/2018 in [place] (1A)  30 Day Unplanned Readmission Risk Score (%)  5 Filed at 11/21/2018 1600     This score is the patient's risk of an unplanned readmission within 30 days of being discharged (0 -100%). The score is based on dignosis, age, lab data, medications, orders, and past utilization.   Low:  0-14.9   Medium: 15-21.9   High: 22-29.9   Extreme: 30 and above       No flowsheet data found.

## 2018-11-22 NOTE — Care Management CC44 (Signed)
Condition Code 44 Documentation Completed  Patient Details  Name: Jo Malone MRN: [id number] Date of Birth: [birth year]   Condition Code 44 given:  Yes Patient signature on Condition Code 44 notice:  Yes Documentation of 2 MD's agreement:  Yes Code 44 added to claim:  Yes    Su Hilt, RN 11/22/2018, 11:02 AM

## 2018-11-22 NOTE — TOC Initial Note (Signed)
Transition of Care [facility]) - Initial/Assessment Note    Patient Details  Name: Jo Malone MRN: [id number] Date of Birth: 11/02/1935  [facility]) CM/SW Contact:    Su Hilt, RN Phone Number: 11/22/2018, 10:36 AM  Clinical Narrative:                  Met with the patient to discuss plan and needs She has a RW and 3 in 1 at home and has no DME needs [facility] list provided and [facility] chose [facility], notified [name] via secure email She has used [facility] in the past I requested for [name] to let me know if they are no INN with the patient's insurance Patient lives at [name] but comes here and stays in an apartment in her Daughters back yard.  Daughter is there to help as needed Transportation is provided by the daughter [facility]  [facility]CP [facility]  Completed the Code 44 and explained Observation to the patient  Expected Discharge Plan: [place] Barriers to Discharge: Continued Medical Work up   Patient Goals and CMS Choice Patient states their goals for this hospitalization and ongoing recovery are:: go home CMS Medicare.gov Compare Post Acute Care list provided to:: Patient Choice offered to / list presented to : Patient  Expected Discharge Plan and Services Expected Discharge Plan: [place] Discharge Planning Services: CM Consult Post Acute Care Choice: [place] arrangements for the past 2 months: Apartment                     HH Arranged: PT [place]: [facility])  Prior Living Arrangements/Services Living arrangements for the past 2 months: Apartment Lives with:: Self Patient language and need for interpreter reviewed:: Yes Do you feel safe going back to the place where you live?: Yes      Need for Family Participation in Patient Care: No (Comment) Care giver support system in place?: Yes (comment) Current home services: (has RW and 3  in 1 at home) Criminal Activity/Legal Involvement Pertinent to Current Situation/Hospitalization: No - Comment as needed  Activities of Daily Living Home Assistive Devices/Equipment: None ADL Screening (condition at time of admission) Patient's cognitive ability adequate to safely complete daily activities?: Yes Is the patient deaf or have difficulty hearing?: No Does the patient have difficulty seeing, even when wearing glasses/contacts?: No Does the patient have difficulty concentrating, remembering, or making decisions?: No Patient able to express need for assistance with ADLs?: Yes Does the patient have difficulty dressing or bathing?: No Independently performs ADLs?: Yes (appropriate for developmental age) Does the patient have difficulty walking or climbing stairs?: Yes Weakness of Legs: Right Weakness of Arms/Hands: None  Permission Sought/Granted Permission sought to share information with : Case Manager Permission granted to share information with : Yes, Verbal Permission Granted     Permission granted to share info w AGENCY: [facility]        Emotional Assessment Appearance:: Appears stated age Attitude/Demeanor/Rapport: Engaged Affect (typically observed): Accepting Orientation: : Oriented to Self, Oriented to Place, Oriented to  Time, Oriented to Situation Alcohol / Substance Use: Never Used Psych Involvement: No (comment)  Admission diagnosis:  Osteoarthritis of right knee [M17.11] Patient Active Problem List   Diagnosis Date Noted  . Osteoarthritis of right knee 11/21/2018  . Localized, primary osteoarthritis of shoulder region 08/17/2017  . Idiopathic peripheral neuropathy  08/17/2017  . Asthma without status asthmaticus 08/17/2017  . Anaphylaxis 08/17/2017  . Fatty liver 05/10/2017  . Chronic constipation 05/10/2017  . S/P total knee arthroplasty 12/17/2015  . Primary osteoarthritis of right knee 10/21/2015  . C. difficile colitis 09/07/2015   PCP:  System,  Pcp Not In Pharmacy:   [id number] [id number]: [phone]: [phone]    Social Determinants of Health (SDOH) Interventions    Readmission Risk Interventions 30 Day Unplanned Readmission Risk Score     Admission ([place])  30 Day Unplanned Readmission Risk Score (%)  5 Filed at 11/21/2018 1600     This score is the patient's risk of an unplanned readmission within 30 days of being discharged (0 -100%). The score is based on dignosis, age, lab data, medications, orders, and past utilization.   Low:  0-14.9   Medium: 15-21.9   High: 22-29.9   Extreme: 30 and above       No flowsheet data found.

## 2018-11-22 NOTE — Progress Notes (Signed)
  Subjective:  Patient reports pain as mild to moderate.  orthostatic earlier with PT  Objective:   VITALS:   Vitals:   11/21/18 2339 11/22/18 0453 11/22/18 0802 11/22/18 1005  BP: (!) 156/72 (!) 151/68 (!) 139/52 (!) 136/50  Pulse: 77 81 80 87  Resp: 18 18 16    Temp: 98 F (36.7 C) 98 F (36.7 C) 97.9 F (36.6 C)   TempSrc: Oral Oral Oral   SpO2: 95% 97% 95%   Weight:      Height:        PHYSICAL EXAM:  Neurologically intact ABD soft Neurovascular intact Sensation intact distally Intact pulses distally Dorsiflexion/Plantar flexion intact Incision: dressing C/D/I No cellulitis present Compartment soft  LABS  Results for orders placed or performed during the hospital encounter of 11/21/18 (from the past 24 hour(s))  CBC     Status: Abnormal   Collection Time: 11/21/18  1:10 PM  Result Value Ref Range   WBC 10.6 (H) 4.0 - 10.5 K/uL   RBC 4.25 3.87 - 5.11 MIL/uL   Hemoglobin 12.0 12.0 - 15.0 g/dL   HCT 37.0 36.0 - 46.0 %   MCV 87.1 80.0 - 100.0 fL   MCH 28.2 26.0 - 34.0 pg   MCHC 32.4 30.0 - 36.0 g/dL   RDW 13.9 11.5 - 15.5 %   Platelets 235 150 - 400 K/uL   nRBC 0.0 0.0 - 0.2 %  CBC     Status: Abnormal   Collection Time: 11/22/18  4:09 AM  Result Value Ref Range   WBC 9.5 4.0 - 10.5 K/uL   RBC 3.70 (L) 3.87 - 5.11 MIL/uL   Hemoglobin 10.3 (L) 12.0 - 15.0 g/dL   HCT 33.3 (L) 36.0 - 46.0 %   MCV 90.0 80.0 - 100.0 fL   MCH 27.8 26.0 - 34.0 pg   MCHC 30.9 30.0 - 36.0 g/dL   RDW 13.9 11.5 - 15.5 %   Platelets 196 150 - 400 K/uL   nRBC 0.0 0.0 - 0.2 %  Basic metabolic panel     Status: Abnormal   Collection Time: 11/22/18  4:09 AM  Result Value Ref Range   Sodium 141 135 - 145 mmol/L   Potassium 3.6 3.5 - 5.1 mmol/L   Chloride 108 98 - 111 mmol/L   CO2 26 22 - 32 mmol/L   Glucose, Bld 101 (H) 70 - 99 mg/dL   BUN 15 8 - 23 mg/dL   Creatinine, Ser 0.90 0.44 - 1.00 mg/dL   Calcium 8.3 (L) 8.9 - 10.3 mg/dL   GFR calc non Af Amer >60 >60 mL/min   GFR  calc Af Amer >60 >60 mL/min   Anion gap 7 5 - 15    Dg Knee Right Port  Result Date: 11/21/2018 CLINICAL DATA:  Status post right knee arthroplasty. EXAM: PORTABLE RIGHT KNEE - 1-2 VIEW COMPARISON:  None. FINDINGS: Right total knee arthroplasty demonstrates normal alignment. No abnormal lucency surrounding prosthetic components. IMPRESSION: Normal alignment of right knee arthroplasty. Electronically Signed   By: Aletta Edouard M.D.   On: 11/21/2018 16:43    Assessment/Plan: 1 Day Post-Op   Active Problems:   Osteoarthritis of right knee   Advance diet Up with therapy Discharge home with home health tomorrow   Carlynn Spry , PA-C 11/22/2018, 1:02 PM

## 2018-11-22 NOTE — Care Management CC44 (Signed)
Condition Code 44 Documentation Completed  Patient Details  Name: LIVIAH CAKE MRN: [id number] Date of Birth: 1936-06-26   Condition Code 44 given:  Yes Patient signature on Condition Code 44 notice:  Yes Documentation of 2 MD's agreement:  Yes Code 44 added to claim:  Yes    Su Hilt, RN 11/22/2018, 10:56 AM

## 2018-11-23 DIAGNOSIS — M1711 Unilateral primary osteoarthritis, right knee: Secondary | ICD-10-CM | POA: Diagnosis not present

## 2018-11-23 LAB — CBC
HCT: 33.2 % — ABNORMAL LOW (ref 36.0–46.0)
Hemoglobin: 10.5 g/dL — ABNORMAL LOW (ref 12.0–15.0)
MCH: 28.6 pg (ref 26.0–34.0)
MCHC: 31.6 g/dL (ref 30.0–36.0)
MCV: 90.5 fL (ref 80.0–100.0)
Platelets: 195 10*3/uL (ref 150–400)
RBC: 3.67 MIL/uL — ABNORMAL LOW (ref 3.87–5.11)
RDW: 14.2 % (ref 11.5–15.5)
WBC: 11.5 10*3/uL — ABNORMAL HIGH (ref 4.0–10.5)
nRBC: 0 % (ref 0.0–0.2)

## 2018-11-23 NOTE — TOC Progression Note (Signed)
Transition of Care [facility]) - Progression Note    Patient Details  Name: Jo Malone MRN: [id number] Date of Birth: 1936-04-22  Transition of Care [facility]) CM/SW [place], RN Phone Number: 11/23/2018, 11:26 AM  Clinical Narrative:    Met with the patient about the recommendation of going to Rehab after seeing PT today, She agreed to to a bed search and start insurance Auth, I spoke with Marlowe Kays her daughter on the phone at [phone] and she stated that rehab is fine and what the patient wanted to begin with, I explained that [place] is not taking patients any longer and that no rehab is allowing visitors at all at this time.  Both the patient and the daughter are understanding. Did a PASR search and the number came back [id number] FL2 and bead search in process  Expected Discharge Plan: [place] Barriers to Discharge: Continued Medical Work up  Expected Discharge Plan and Services Expected Discharge Plan: [place] Discharge Planning Services: CM Consult Post Acute Care Choice: [place] arrangements for the past 2 months: Apartment                     HH Arranged: PT [place]: [facility])   Social Determinants of Health (SDOH) Interventions    Readmission Risk Interventions 30 Day Unplanned Readmission Risk Score     Admission (Current) from 11/21/2018 in [place] (1A)  30 Day Unplanned Readmission Risk Score (%)  5 Filed at 11/21/2018 1600     This score is the patient's risk of an unplanned readmission within 30 days of being discharged (0 -100%). The score is based on dignosis, age, lab data, medications, orders, and past utilization.   Low:  0-14.9   Medium: 15-21.9   High: 22-29.9   Extreme: 30 and above       No flowsheet data found.

## 2018-11-23 NOTE — Progress Notes (Signed)
Physical Therapy Treatment Patient Details Name: Jo Malone MRN: [id number] DOB: [birth year] Today's Date: 11/23/2018    History of Present Illness Pt is an 83 yo female s/p elective R TKA.  PMH includes: L TKA, neuropathy OA, and hiatal hernia.      PT Comments    Pt presents with deficits in strength, transfers, mobility, gait, balance, R knee ROM, and activity tolerance.  Pt demonstrated some minimal improvement this session compared to the AM session in regards to amb but continues to be quite limited overall.  Pt was able to take several very small step-to steps at the EOB with heavy cueing for sequencing and therapeutic standing rest breaks for pain relief after each step.  Pt pleasant and motivated to participate but remains limited functionally making discharge to her prior living situation unsafe at this time.  Pt will benefit from PT services in a SNF setting upon discharge to safely address above deficits for decreased caregiver assistance and eventual return to PLOF.     Follow Up Recommendations  SNF;Supervision for mobility/OOB     Equipment Recommendations  None recommended by PT    Recommendations for Other Services       Precautions / Restrictions Precautions Precautions: Knee Restrictions Weight Bearing Restrictions: Yes RLE Weight Bearing: Weight bearing as tolerated    Mobility  Bed Mobility Overal bed mobility: Modified Independent             General bed mobility comments: Extra time and effort with bed mobility tasks but no physical assistance required  Transfers Overall transfer level: Needs assistance Equipment used: Rolling walker (2 wheeled) Transfers: Sit to/from Stand Sit to Stand: Min guard;From elevated surface         General transfer comment: Min verbal cues for sequencing with extra time and effort required  Ambulation/Gait Ambulation/Gait assistance: Min guard Gait Distance (Feet): 3 Feet Assistive device: Rolling walker  (2 wheeled) Gait Pattern/deviations: Antalgic;Decreased stance time - right;Decreased step length - left;Step-to pattern;Trunk flexed Gait velocity: decreased   General Gait Details: Pt able to take several very small steps at the EOB with a high degree of effort and therapeutic rest breaks between steps.   Stairs             Wheelchair Mobility    Modified Rankin (Stroke Patients Only)       Balance Overall balance assessment: Needs assistance   Sitting balance-Leahy Scale: Good     Standing balance support: Bilateral upper extremity supported Standing balance-Leahy Scale: Fair Standing balance comment: Heavy lean on the RW for support at times                            Cognition Arousal/Alertness: Awake/alert Behavior During Therapy: WFL for tasks assessed/performed Overall Cognitive Status: Within Functional Limits for tasks assessed                                        Exercises Total Joint Exercises Ankle Circles/Pumps: AROM;Both;10 reps Quad Sets: Strengthening;AROM;Both;10 reps Gluteal Sets: Strengthening;Both;10 reps Short Arc Quad: Strengthening;Both;10 reps Hip ABduction/ADduction: AROM;Both;AAROM;10 reps Straight Leg Raises: AROM;AAROM;Both;10 reps Long Arc Quad: AROM;Strengthening;Right;10 reps;15 reps Knee Flexion: AROM;Right;10 reps;15 reps;Strengthening Goniometric ROM: R knee AROM: 6-82 deg Marching in Standing: AROM;Standing;10 reps;Right Other Exercises Other Exercises: HEP education/review per handout Other Exercises: Standing weight shifting left/right to encourage increased RLE  WB  Other Exercises: Standing weight shifting left/right to encourage increased RLE WB     General Comments        Pertinent Vitals/Pain Pain Assessment: 0-10 Pain Score: 6  Pain Location: R knee Pain Descriptors / Indicators: Aching;Sore Pain Intervention(s): Limited activity within patient's tolerance;Premedicated before  session;Monitored during session    Home Living                      Prior Function            PT Goals (current goals can now be found in the care plan section) Progress towards PT goals: Progressing toward goals    Frequency    BID      PT Plan Current plan remains appropriate    Co-evaluation              AM-PAC PT "6 Clicks" Mobility   Outcome Measure  Help needed turning from your back to your side while in a flat bed without using bedrails?: A Little Help needed moving from lying on your back to sitting on the side of a flat bed without using bedrails?: A Little Help needed moving to and from a bed to a chair (including a wheelchair)?: A Little Help needed standing up from a chair using your arms (e.g., wheelchair or bedside chair)?: A Little Help needed to walk in hospital room?: Total Help needed climbing 3-5 steps with a railing? : Total 6 Click Score: 14    End of Session Equipment Utilized During Treatment: Gait belt Activity Tolerance: Patient limited by pain Patient left: in bed;with bed alarm set;with call bell/phone within reach;with SCD's reapplied;Other (comment)(Polar care to RLE) Nurse Communication: Mobility status PT Visit Diagnosis: Muscle weakness (generalized) (M62.81);Other abnormalities of gait and mobility (R26.89);Pain Pain - Right/Left: Right Pain - part of body: Knee     Time: 8185-6314 PT Time Calculation (min) (ACUTE ONLY): 24 min  Charges:  $Gait Training: 8-22 mins $Therapeutic Exercise: 8-22 mins $Therapeutic Activity: 8-22 mins                     D. Scott Jeferson Boozer PT, DPT 11/23/18, 2:10 PM

## 2018-11-23 NOTE — Progress Notes (Signed)
Physical Therapy Treatment Patient Details Name: Jo Malone MRN: [id number] DOB: October 29, 1935 Today's Date: 11/23/2018    History of Present Illness Pt is an 83 yo female s/p elective R TKA.  PMH includes: L TKA, neuropathy OA, and hiatal hernia.      PT Comments    Pt presents with deficits in strength, transfers, mobility, gait, R knee ROM, balance, and activity tolerance.  Pt was Mod Ind with bed mobility tasks with extra effort required but no physical assistance.  Pt required CGA and cues for sequencing during sit to/from stand transfers.  Once in standing pt was able to lift her R foot from the floor but was unable to advance her LLE.  Pt participated in multiple sit to/from stands with attempts made each time to take one step from the EOB with pt unable to do so. Pt presents with a significant decline in functional status related to ambulation compared to previous sessions and would not be safe to return to her prior living situation at this time.  Pt will now benefit from PT services in a SNF setting upon discharge to safely address above deficits for decreased caregiver assistance and eventual return to PLOF.     Follow Up Recommendations  SNF;Supervision for mobility/OOB     Equipment Recommendations  None recommended by PT    Recommendations for Other Services       Precautions / Restrictions Precautions Precautions: Knee Restrictions Weight Bearing Restrictions: Yes RLE Weight Bearing: Weight bearing as tolerated    Mobility  Bed Mobility Overal bed mobility: Modified Independent             General bed mobility comments: Extra time and effort with bed mobility tasks but no physical assistance required  Transfers Overall transfer level: Needs assistance Equipment used: Rolling walker (2 wheeled) Transfers: Sit to/from Stand Sit to Stand: Min guard         General transfer comment: Min verbal cues for sequencing with extra time and effort  required  Ambulation/Gait             General Gait Details: Unable to advance LLE this session    Stairs             Wheelchair Mobility    Modified Rankin (Stroke Patients Only)       Balance Overall balance assessment: Needs assistance   Sitting balance-Leahy Scale: Good     Standing balance support: Bilateral upper extremity supported Standing balance-Leahy Scale: Fair Standing balance comment: Heavy lean on the RW for support at times                            Cognition Arousal/Alertness: Awake/alert Behavior During Therapy: WFL for tasks assessed/performed Overall Cognitive Status: Within Functional Limits for tasks assessed                                        Exercises Total Joint Exercises Ankle Circles/Pumps: AROM;Both;10 reps;5 reps Quad Sets: Strengthening;AROM;Both;10 reps;15 reps Gluteal Sets: Strengthening;Both;10 reps;5 reps Short Arc Quad: Strengthening;Both;10 reps Hip ABduction/ADduction: AROM;Both;AAROM;10 reps;15 reps Straight Leg Raises: AROM;AAROM;Both;10 reps;15 reps Long Arc Quad: AROM;Strengthening;Right;10 reps;15 reps Knee Flexion: AROM;Right;10 reps;15 reps;Strengthening Goniometric ROM: R knee AROM: 6-82 deg Marching in Standing: AROM;5 reps;Standing;10 reps;Left Other Exercises Other Exercises: HEP education/review per handout Other Exercises: Positioning education/review to encourage R knee ext  PROM Other Exercises: Standing weight shifting left/right to encourage increased RLE WB     General Comments        Pertinent Vitals/Pain Pain Assessment: 0-10 Pain Score: 6  Pain Location: R knee Pain Descriptors / Indicators: Aching;Sore Pain Intervention(s): Limited activity within patient's tolerance;Monitored during session;Patient requesting pain meds-RN notified    Home Living                      Prior Function            PT Goals (current goals can now be found in the  care plan section) Progress towards PT goals: Not progressing toward goals - comment(limited by pain this session)    Frequency    BID      PT Plan Discharge plan needs to be updated    Co-evaluation              AM-PAC PT "6 Clicks" Mobility   Outcome Measure  Help needed turning from your back to your side while in a flat bed without using bedrails?: A Little Help needed moving from lying on your back to sitting on the side of a flat bed without using bedrails?: A Little Help needed moving to and from a bed to a chair (including a wheelchair)?: A Lot Help needed standing up from a chair using your arms (e.g., wheelchair or bedside chair)?: A Little Help needed to walk in hospital room?: Total Help needed climbing 3-5 steps with a railing? : Total 6 Click Score: 13    End of Session Equipment Utilized During Treatment: Gait belt Activity Tolerance: Patient limited by pain Patient left: in bed;with bed alarm set;with call bell/phone within reach;with SCD's reapplied;Other (comment)(Polar care to R knee) Nurse Communication: Mobility status;Patient requests pain meds PT Visit Diagnosis: Muscle weakness (generalized) (M62.81);Other abnormalities of gait and mobility (R26.89);Pain Pain - Right/Left: Right Pain - part of body: Knee     Time: 9379-0240 PT Time Calculation (min) (ACUTE ONLY): 39 min  Charges:  $Therapeutic Exercise: 23-37 mins $Therapeutic Activity: 8-22 mins                     D. Scott Keddrick Wyne PT, DPT 11/23/18, 11:22 AM

## 2018-11-23 NOTE — Progress Notes (Addendum)
  Subjective:  Patient reports pain as moderate.    Objective:   VITALS:   Vitals:   11/22/18 1542 11/22/18 2357 11/23/18 0722 11/23/18 0837  BP: 104/60 (!) 98/44 (!) 130/53   Pulse: 79 92 89   Resp: 18 19    Temp: 98 F (36.7 C) 98.4 F (36.9 C) (!) 101.5 F (38.6 C) 100.3 F (37.9 C)  TempSrc: Oral Oral Oral   SpO2: 98% 93% 94%   Weight:      Height:        PHYSICAL EXAM:  ABD soft Sensation intact distally Dorsiflexion/Plantar flexion intact Incision: dressing C/D/I Compartment soft  LABS  Results for orders placed or performed during the hospital encounter of 11/21/18 (from the past 24 hour(s))  CBC     Status: Abnormal   Collection Time: 11/23/18  3:31 AM  Result Value Ref Range   WBC 11.5 (H) 4.0 - 10.5 K/uL   RBC 3.67 (L) 3.87 - 5.11 MIL/uL   Hemoglobin 10.5 (L) 12.0 - 15.0 g/dL   HCT 33.2 (L) 36.0 - 46.0 %   MCV 90.5 80.0 - 100.0 fL   MCH 28.6 26.0 - 34.0 pg   MCHC 31.6 30.0 - 36.0 g/dL   RDW 14.2 11.5 - 15.5 %   Platelets 195 150 - 400 K/uL   nRBC 0.0 0.0 - 0.2 %    Dg Knee Right Port  Result Date: 11/21/2018 CLINICAL DATA:  Status post right knee arthroplasty. EXAM: PORTABLE RIGHT KNEE - 1-2 VIEW COMPARISON:  None. FINDINGS: Right total knee arthroplasty demonstrates normal alignment. No abnormal lucency surrounding prosthetic components. IMPRESSION: Normal alignment of right knee arthroplasty. Electronically Signed   By: Aletta Edouard M.D.   On: 11/21/2018 16:43    Assessment/Plan: 2 Days Post-Op   Active Problems:   Osteoarthritis of right knee   Up with therapy Discharge home with home health versus possible SNF placement. Will assess her progress later today.  Plan home health PT 3 times per week and a home health aide. The Face to Face form is completed.    Lovell Sheehan , MD 11/23/2018, 11:37 AM

## 2018-11-23 NOTE — TOC Progression Note (Signed)
Transition of Care [facility]) - Progression Note    Patient Details  Name: Jo Malone MRN: [id number] Date of Birth: 1936-07-14  Transition of Care [facility]) CM/SW [place], RN Phone Number: 11/23/2018, 11:46 AM  Clinical Narrative:     Due to the Covid 19 virus concerns the patient would like to go home with Home health, she has already chosen [facility], they will see her 3 times per week per [name] with [facility] and Dr. Harlow Mares will order. Will also order a nursing aide. Cancelled completing the bed request for rehab  Expected Discharge Plan: [place] Barriers to Discharge: Continued Medical Work up  Expected Discharge Plan and Services Expected Discharge Plan: [place]   Discharge Planning Services: CM Consult Post Acute Care Choice: [place] arrangements for the past 2 months: Apartment                     HH Arranged: PT [place]: [facility])   Social Determinants of Health (SDOH) Interventions    Readmission Risk Interventions No flowsheet data found.

## 2018-11-24 DIAGNOSIS — M1711 Unilateral primary osteoarthritis, right knee: Secondary | ICD-10-CM | POA: Diagnosis not present

## 2018-11-24 LAB — CBC
HCT: 33.5 % — ABNORMAL LOW (ref 36.0–46.0)
Hemoglobin: 10.3 g/dL — ABNORMAL LOW (ref 12.0–15.0)
MCH: 28 pg (ref 26.0–34.0)
MCHC: 30.7 g/dL (ref 30.0–36.0)
MCV: 91 fL (ref 80.0–100.0)
Platelets: 186 10*3/uL (ref 150–400)
RBC: 3.68 MIL/uL — ABNORMAL LOW (ref 3.87–5.11)
RDW: 14 % (ref 11.5–15.5)
WBC: 10.6 10*3/uL — ABNORMAL HIGH (ref 4.0–10.5)
nRBC: 0 % (ref 0.0–0.2)

## 2018-11-24 MED ORDER — ASPIRIN 81 MG PO CHEW: 81.0000 mg | CHEWABLE_TABLET | Freq: Two times a day (BID) | ORAL | 1 refills | Status: AC

## 2018-11-24 MED ORDER — DOCUSATE SODIUM 100 MG PO CAPS: 100.0000 mg | ORAL_CAPSULE | Freq: Two times a day (BID) | ORAL | 0 refills | Status: AC

## 2018-11-24 MED ORDER — HYDROCODONE-ACETAMINOPHEN 5-325 MG PO TABS: 1.0000 | ORAL_TABLET | ORAL | 0 refills | Status: AC | PRN

## 2018-11-24 NOTE — Discharge Summary (Signed)
Physician Discharge Summary  Patient ID: Jo Malone MRN: [id number] DOB/AGE: 1936/08/12 83 y.o.  Admit date: 11/21/2018 Discharge date: 11/24/2018  Admission Diagnoses:  RIGHT KNEE OSTEOARTHRITIS <principal problem not specified>  Discharge Diagnoses:  RIGHT KNEE OSTEOARTHRITIS Active Problems:   Osteoarthritis of right knee   Past Medical History:  Diagnosis Date  . Anxiety   . Arthritis   . Asthma   . Complication of anesthesia    Difficulty with intubation and cancelled surgery  . GERD (gastroesophageal reflux disease)   . History of hiatal hernia   . Neuropathy     Surgeries: Procedure(s): TOTAL KNEE ARTHROPLASTY-RIGHT on 11/21/2018   Consultants (if any):   Discharged Condition: Improved  Hospital Course: Jo Malone is an 83 y.o. female who was admitted 11/21/2018 with a diagnosis of  RIGHT KNEE OSTEOARTHRITIS <principal problem not specified> and went to the operating room on 11/21/2018 and underwent the above named procedures.    She was given perioperative antibiotics:  Anti-infectives (From admission, onward)   Start     Dose/Rate Route Frequency Ordered Stop   11/21/18 1900  clindamycin (CLEOCIN) IVPB 600 mg     600 mg 100 mL/hr over 30 Minutes Intravenous Every 6 hours 11/21/18 1738 11/22/18 0050   11/21/18 1502  50,000 units bacitracin in 0.9% normal saline 250 mL irrigation  Status:  Discontinued       As needed 11/21/18 1503 11/21/18 1552   11/21/18 1237  clindamycin (CLEOCIN) 900 MG/50ML IVPB    Note to Pharmacy:  [place], Candace   : cabinet override      11/21/18 1237 11/21/18 1420   11/21/18 0600  clindamycin (CLEOCIN) IVPB 900 mg     900 mg 100 mL/hr over 30 Minutes Intravenous On call to O.R. 11/20/18 2142 11/21/18 1430    .  She was given sequential compression devices, early ambulation, and aspirin for DVT prophylaxis.  She benefited maximally from the hospital stay and there were no complications.    Recent vital signs:   Vitals:   11/23/18 2308 11/23/18 2309  BP: (!) 132/53   Pulse: 88   Resp: 19   Temp: 99.1 F (37.3 C)   SpO2: 91% 92%    Recent laboratory studies:  Lab Results  Component Value Date   HGB 10.3 (L) 11/24/2018   HGB 10.5 (L) 11/23/2018   HGB 10.3 (L) 11/22/2018   Lab Results  Component Value Date   WBC 10.6 (H) 11/24/2018   PLT 186 11/24/2018   Lab Results  Component Value Date   INR 0.9 11/17/2018   Lab Results  Component Value Date   NA 141 11/22/2018   K 3.6 11/22/2018   CL 108 11/22/2018   CO2 26 11/22/2018   BUN 15 11/22/2018   CREATININE 0.90 11/22/2018   GLUCOSE 101 (H) 11/22/2018    Discharge Medications:   Allergies as of 11/24/2018      Reactions   Vancomycin Anaphylaxis   Penicillins Hives   Has patient had a PCN reaction causing immediate rash, facial/tongue/throat swelling, SOB or lightheadedness with hypotension: No Has patient had a PCN reaction causing severe rash involving mucus membranes or skin necrosis: No Has patient had a PCN reaction that required hospitalization: No Has patient had a PCN reaction occurring within the last 10 years: No If all of the above answers are "NO", then may proceed with Cephalosporin use.   Tramadol Diarrhea, Nausea Only      Medication List    TAKE  these medications   acetaminophen 500 MG tablet Commonly known as:  TYLENOL Take 500 mg by mouth every 8 (eight) hours as needed for mild pain.   ALPRAZolam 0.25 MG tablet Commonly known as:  XANAX Take 0.25 mg by mouth daily as needed for anxiety.   aspirin 81 MG chewable tablet Chew 1 tablet (81 mg total) by mouth 2 (two) times daily.   cholecalciferol 1000 units tablet Commonly known as:  VITAMIN D Take 1,000 Units by mouth daily.   docusate sodium 100 MG capsule Commonly known as:  COLACE Take 1 capsule (100 mg total) by mouth 2 (two) times daily.   furosemide 40 MG tablet Commonly known as:  LASIX Take 40 mg by mouth daily as needed for fluid or  edema.   gabapentin 600 MG tablet Commonly known as:  NEURONTIN Take 600 mg by mouth 3 (three) times daily.   HYDROcodone-acetaminophen 5-325 MG tablet Commonly known as:  NORCO/VICODIN Take 1 tablet by mouth every 4 (four) hours as needed (pain score 4-6).   meloxicam 7.5 MG tablet Commonly known as:  MOBIC Take 7.5 mg by mouth 2 (two) times daily.   montelukast 10 MG tablet Commonly known as:  SINGULAIR Take 10 mg by mouth at bedtime.   pantoprazole 40 MG tablet Commonly known as:  Protonix Take 1 tablet (40 mg total) by mouth daily.   Potassium 99 MG Tabs Take 99 mg by mouth daily.   ProAir HFA 108 (90 Base) MCG/ACT inhaler Generic drug:  albuterol Inhale 1 puff into the lungs 2 (two) times daily.   albuterol (2.5 MG/3ML) 0.083% nebulizer solution Commonly known as:  PROVENTIL Take 2.5 mg by nebulization every 6 (six) hours as needed for wheezing or shortness of breath.   sertraline 50 MG tablet Commonly known as:  ZOLOFT Take 50 mg by mouth daily.   Symbicort 160-4.5 MCG/ACT inhaler Generic drug:  budesonide-formoterol Inhale 1 puff into the lungs 2 (two) times daily.   vitamin B-12 1000 MCG tablet Commonly known as:  CYANOCOBALAMIN Take 1,000 mcg by mouth daily.   zolpidem 10 MG tablet Commonly known as:  AMBIEN Take 10 mg by mouth at bedtime.       Diagnostic Studies: Dg Knee Right Port  Result Date: 11/21/2018 CLINICAL DATA:  Status post right knee arthroplasty. EXAM: PORTABLE RIGHT KNEE - 1-2 VIEW COMPARISON:  None. FINDINGS: Right total knee arthroplasty demonstrates normal alignment. No abnormal lucency surrounding prosthetic components. IMPRESSION: Normal alignment of right knee arthroplasty. Electronically Signed   By: Aletta Edouard M.D.   On: 11/21/2018 16:43    Disposition: Discharge disposition: 01-Home or Self Care            Signed: Carlynn Spry ,PA-C 11/24/2018, 7:03 AM

## 2018-11-24 NOTE — Progress Notes (Signed)
Physical Therapy Treatment Patient Details Name: Jo Malone MRN: [id number] DOB: [birth year] Today's Date: 11/24/2018    History of Present Illness Pt is an 83 yo female s/p elective R TKA.  PMH includes: L TKA, neuropathy OA, and hiatal hernia.      PT Comments    Patient on bedpan upon PT arrival. Transition off bedpan with assistance, rolling Mod independently with handrails. Patient very fearful and painful upon movement of RLE and requires additional time for movement of LE. Upon seated EOB requires slow flexion to allow foot to rest on floor. Able to transfer with additional time to standing however upon attempt to ambulate with RW was unable to weight shift onto RLE for movement/clearance of LLE creating a shuffle type pattern of ambulation for ~3-4 ft, required assistance to return to bed due to pain increase and fear of continued movement. Patient is scheduled to d/c today so home exercise program initiated with paperwork given to patient for compliance. Due to patient's limited mobility and need for assistance for safety SNF placement is recommended, however due to patient preference patient is going home with [facility] PT. Patient will benefit from skilled physical therapy to continue improving R knee ROM, functional mobility and strength, and improve transfers for safe negotiation of environment.    Follow Up Recommendations  SNF;Supervision for mobility/OOB     Equipment Recommendations  None recommended by PT    Recommendations for Other Services       Precautions / Restrictions Precautions Precautions: Knee Precaution Comments: knee packet given Restrictions RLE Weight Bearing: Weight bearing as tolerated    Mobility  Bed Mobility Overal bed mobility: Modified Independent             General bed mobility comments: Extra time and effort with bed mobility tasks but no physical assistance required  Transfers Overall transfer level: Needs assistance Equipment used:  Rolling walker (2 wheeled) Transfers: Sit to/from Stand Sit to Stand: Min guard;From elevated surface         General transfer comment: Min verbal cues for sequencing with extra time and effort required, limited weight shift onto RLE, requires Min assistance to return to bed due to fear of mobility/weight acceptance onto RLE.   Ambulation/Gait Ambulation/Gait assistance: Min guard Gait Distance (Feet): 3 Feet Assistive device: Rolling walker (2 wheeled) Gait Pattern/deviations: Antalgic;Decreased stance time - right;Decreased step length - left;Step-to pattern;Trunk flexed Gait velocity: decreased   General Gait Details: Pt able to take several very small steps at the EOB with a high degree of effort and therapeutic rest breaks between steps. Shuffles foot forward due to limited weight acceptance onto RLE, became fearful and "frozen" requiring tactile assistance for weight shift and manuevering back into bed.    Stairs             Wheelchair Mobility    Modified Rankin (Stroke Patients Only)       Balance Overall balance assessment: Needs assistance   Sitting balance-Leahy Scale: Good Sitting balance - Comments: reach inside and outside BOS    Standing balance support: Bilateral upper extremity supported Standing balance-Leahy Scale: Fair Standing balance comment: Heavy lean on the RW for support at times with mobility, static stand with SUE support reaching for stability x2 minutes                             Cognition Arousal/Alertness: Awake/alert Behavior During Therapy: WFL for tasks assessed/performed Overall Cognitive Status:  Within Functional Limits for tasks assessed                                        Exercises Total Joint Exercises Ankle Circles/Pumps: AROM;Both;10 reps Quad Sets: Strengthening;AROM;Both;10 reps Gluteal Sets: Strengthening;Both;10 reps Hip ABduction/ADduction: AROM;Both;AAROM;10 reps Straight Leg  Raises: AROM;AAROM;Both;10 reps Long Arc Quad: AROM;Strengthening;Right;10 reps;15 reps Knee Flexion: AROM;Right;10 reps;15 reps;Strengthening Goniometric ROM:  R knee AROM 5-79 with pain at end range passively  Marching in Standing: AROM;Standing;10 reps;Right(shuffle step , limited acceptance onto RLE) Other Exercises Other Exercises: HEP education/review per handout: give patient handout for home review  Other Exercises: Standing weight shifting left/right to encourage increased RLE WB  Other Exercises: purse lipped breathing for reduction of pain in standing to encourage weight shift and ambulation  Other Exercises: bed mobility transferring off of bed pan, cleaning after bed pan use    General Comments        Pertinent Vitals/Pain Pain Assessment: 0-10 Pain Score: 6  Pain Location: R knee Pain Descriptors / Indicators: Aching;Sore Pain Intervention(s): Monitored during session    Home Living                      Prior Function            PT Goals (current goals can now be found in the care plan section) Acute Rehab PT Goals Patient Stated Goal: To walk better and get stronger PT Goal Formulation: With patient Time For Goal Achievement: 12/05/18 Potential to Achieve Goals: Good Progress towards PT goals: Progressing toward goals    Frequency    BID      PT Plan Current plan remains appropriate    Co-evaluation              AM-PAC PT "6 Clicks" Mobility   Outcome Measure  Help needed turning from your back to your side while in a flat bed without using bedrails?: A Little Help needed moving from lying on your back to sitting on the side of a flat bed without using bedrails?: A Little Help needed moving to and from a bed to a chair (including a wheelchair)?: A Little Help needed standing up from a chair using your arms (e.g., wheelchair or bedside chair)?: A Little Help needed to walk in hospital room?: Total Help needed climbing 3-5 steps with  a railing? : Total 6 Click Score: 14    End of Session Equipment Utilized During Treatment: Gait belt Activity Tolerance: Patient limited by pain Patient left: in bed;with bed alarm set;with call bell/phone within reach;with SCD's reapplied;Other (comment)(Polar care to RLE, nurse came in to provide pain meds) Nurse Communication: Mobility status PT Visit Diagnosis: Muscle weakness (generalized) (M62.81);Other abnormalities of gait and mobility (R26.89);Pain Pain - Right/Left: Right Pain - part of body: Knee     Time: 0354-6568 PT Time Calculation (min) (ACUTE ONLY): 28 min  Charges:  $Therapeutic Exercise: 8-22 mins $Therapeutic Activity: 8-22 mins                    Janna Arch, PT, DPT    Janna Arch 11/24/2018, 9:45 AM

## 2018-11-24 NOTE — TOC Transition Note (Signed)
Transition of Care [facility]) - CM/SW Discharge Note   Patient Details  Name: JAMYIAH LABELLA MRN: [id number] Date of Birth: 03-Sep-1936  [facility]) CM/SW Contact:  Su Hilt, RN Phone Number: 11/24/2018, 8:53 AM   Clinical Narrative:     Patient to Discharge home 11/24/18 with [facility] to come 3 times a week accepted by [name], DME not needed  Final next level of care: [place] Barriers to Discharge: Barriers Resolved   Patient Goals and CMS Choice Patient states their goals for this hospitalization and ongoing recovery are:: go home CMS Medicare.gov Compare Post Acute Care list provided to:: Patient Choice offered to / list presented to : Patient  Discharge Placement                       Discharge Plan and Services   Discharge Planning Services: CM Consult Post Acute Care Choice: [place]: PT [place]: [facility] (now [facility])   Social Determinants of Health (SDOH) Interventions     Readmission Risk Interventions No flowsheet data found.

## 2018-11-24 NOTE — Patient Instructions (Signed)
Total knee HEP given to patient.

## 2018-11-24 NOTE — Progress Notes (Signed)
  Subjective:  Patient reports pain as mild to moderate.  Slow with PT  Objective:   VITALS:   Vitals:   11/23/18 0837 11/23/18 1804 11/23/18 2308 11/23/18 2309  BP:  (!) 144/68 (!) 132/53   Pulse:  85 88   Resp:   19   Temp: 100.3 F (37.9 C) 98.7 F (37.1 C) 99.1 F (37.3 C)   TempSrc:  Oral Oral   SpO2:  96% 91% 92%  Weight:      Height:        PHYSICAL EXAM:  Neurologically intact ABD soft Neurovascular intact Sensation intact distally Intact pulses distally Dorsiflexion/Plantar flexion intact Incision: dressing C/D/I No cellulitis present Compartment soft  LABS  Results for orders placed or performed during the hospital encounter of 11/21/18 (from the past 24 hour(s))  CBC     Status: Abnormal   Collection Time: 11/24/18  4:56 AM  Result Value Ref Range   WBC 10.6 (H) 4.0 - 10.5 K/uL   RBC 3.68 (L) 3.87 - 5.11 MIL/uL   Hemoglobin 10.3 (L) 12.0 - 15.0 g/dL   HCT 33.5 (L) 36.0 - 46.0 %   MCV 91.0 80.0 - 100.0 fL   MCH 28.0 26.0 - 34.0 pg   MCHC 30.7 30.0 - 36.0 g/dL   RDW 14.0 11.5 - 15.5 %   Platelets 186 150 - 400 K/uL   nRBC 0.0 0.0 - 0.2 %    No results found.  Assessment/Plan: 3 Days Post-Op   Active Problems:   Osteoarthritis of right knee   Advance diet Up with therapy  Discharge home with home health after PT today  Plan home health PT 3 times per week and a home health aide. The Face to Face form is completed.    Carlynn Spry , PA-C 11/24/2018, 6:54 AM

## 2018-11-24 NOTE — Discharge Instructions (Signed)
Continue weight bear as tolerated on the right lower extremity.    Elevate the right lower extremity whenever possible and continue the polar care while elevating the extremity. Patient may shower. No bath or submerging the wound.    Take aspirin as directed for blood clot prevention.  Continue to work on knee range of motion exercises at home as instructed by physical therapy. Continue to use a walker for assistance with ambulation until cleared by physical therapy.  Call [phone] with any questions, such as fever > 101.5 degrees, drainage from the wound or shortness of breath.  

## 2018-11-24 NOTE — Progress Notes (Signed)
Patient is being discharged home with [facility]. DC & Rx instructions given and patient acknowledged understanding. IV removed. Belongings packed. Daughter bedside to take her home.

## 2022-01-08 ENCOUNTER — Encounter: Payer: Self-pay | Admitting: Intensive Care

## 2022-01-08 ENCOUNTER — Emergency Department
Admission: EM | Admit: 2022-01-08 | Discharge: 2022-01-08 | Disposition: A | Discharge status: Home / Self Care | Payer: Medicare Other | Attending: Emergency Medicine | Admitting: Emergency Medicine

## 2022-01-08 ENCOUNTER — Emergency Department: Payer: Medicare Other

## 2022-01-08 ENCOUNTER — Other Ambulatory Visit: Payer: Self-pay

## 2022-01-08 DIAGNOSIS — R197 Diarrhea, unspecified: Secondary | ICD-10-CM | POA: Insufficient documentation

## 2022-01-08 DIAGNOSIS — K219 Gastro-esophageal reflux disease without esophagitis: Secondary | ICD-10-CM | POA: Insufficient documentation

## 2022-01-08 DIAGNOSIS — G8929 Other chronic pain: Secondary | ICD-10-CM

## 2022-01-08 DIAGNOSIS — R109 Unspecified abdominal pain: Secondary | ICD-10-CM | POA: Diagnosis present

## 2022-01-08 DIAGNOSIS — J45909 Unspecified asthma, uncomplicated: Secondary | ICD-10-CM | POA: Diagnosis not present

## 2022-01-08 DIAGNOSIS — R110 Nausea: Secondary | ICD-10-CM

## 2022-01-08 LAB — URINALYSIS, ROUTINE W REFLEX MICROSCOPIC
Bilirubin Urine: NEGATIVE
Glucose, UA: NEGATIVE mg/dL
Hgb urine dipstick: NEGATIVE
Ketones, ur: NEGATIVE mg/dL
Leukocytes,Ua: NEGATIVE
Nitrite: NEGATIVE
Protein, ur: NEGATIVE mg/dL
Specific Gravity, Urine: 1.005 (ref 1.005–1.030)
pH: 6 (ref 5.0–8.0)

## 2022-01-08 LAB — CBC
HCT: 38.3 % (ref 36.0–46.0)
Hemoglobin: 12 g/dL (ref 12.0–15.0)
MCH: 26.1 pg (ref 26.0–34.0)
MCHC: 31.3 g/dL (ref 30.0–36.0)
MCV: 83.3 fL (ref 80.0–100.0)
Platelets: 248 10*3/uL (ref 150–400)
RBC: 4.6 MIL/uL (ref 3.87–5.11)
RDW: 15.3 % (ref 11.5–15.5)
WBC: 10.1 10*3/uL (ref 4.0–10.5)
nRBC: 0 % (ref 0.0–0.2)

## 2022-01-08 LAB — COMPREHENSIVE METABOLIC PANEL
ALT: 18 U/L (ref 0–44)
AST: 30 U/L (ref 15–41)
Albumin: 3.8 g/dL (ref 3.5–5.0)
Alkaline Phosphatase: 67 U/L (ref 38–126)
Anion gap: 11 (ref 5–15)
BUN: 14 mg/dL (ref 8–23)
CO2: 25 mmol/L (ref 22–32)
Calcium: 9.2 mg/dL (ref 8.9–10.3)
Chloride: 101 mmol/L (ref 98–111)
Creatinine, Ser: 0.96 mg/dL (ref 0.44–1.00)
GFR, Estimated: 59 mL/min — ABNORMAL LOW (ref >60)
Glucose, Bld: 118 mg/dL — ABNORMAL HIGH (ref 70–99)
Potassium: 3.6 mmol/L (ref 3.5–5.1)
Sodium: 137 mmol/L (ref 135–145)
Total Bilirubin: 0.8 mg/dL (ref 0.3–1.2)
Total Protein: 7.6 g/dL (ref 6.5–8.1)

## 2022-01-08 LAB — LIPASE, BLOOD: Lipase: 28 U/L (ref 11–51)

## 2022-01-08 MED ORDER — ONDANSETRON 4 MG PO TBDP
4.0000 mg | ORAL_TABLET | Freq: Once | ORAL | Status: AC
  Administered 2022-01-08: 4 mg via ORAL
  Filled 2022-01-08: qty 1

## 2022-01-08 MED ORDER — OMEPRAZOLE MAGNESIUM 20 MG PO TBEC: 20.0000 mg | DELAYED_RELEASE_TABLET | Freq: Every day | ORAL | 1 refills | Status: DC

## 2022-01-08 MED ORDER — ONDANSETRON 4 MG PO TBDP: 4.0000 mg | ORAL_TABLET | Freq: Three times a day (TID) | ORAL | 0 refills | Status: DC | PRN

## 2022-01-08 MED ORDER — ONDANSETRON HCL 4 MG/2ML IJ SOLN
4.0000 mg | Freq: Once | INTRAMUSCULAR | Status: AC
  Administered 2022-01-08: 4 mg via INTRAVENOUS
  Filled 2022-01-08: qty 2

## 2022-01-08 MED ORDER — IOHEXOL 300 MG/ML  SOLN
100.0000 mL | Freq: Once | INTRAMUSCULAR | Status: AC | PRN
  Administered 2022-01-08: 100 mL via INTRAVENOUS
  Filled 2022-01-08: qty 100

## 2022-01-08 MED ORDER — LACTATED RINGERS IV BOLUS
1000.0000 mL | Freq: Once | INTRAVENOUS | Status: AC
  Administered 2022-01-08: 1000 mL via INTRAVENOUS

## 2022-01-08 NOTE — ED Notes (Signed)
Pt daughter came to front desk stating that her mother is in a lot of pain and asking when she would be seen. Pt daughter informed that pt should be one of the next to go back but we do not have any beds available right now. RN will recheck vital signs.  ?

## 2022-01-08 NOTE — ED Notes (Addendum)
PT provided with dc ppw. Pt questions answered.  Pt follow up and rx information reviewed. Pt declines vs at dc. Pt provides verbal consent for dc and pushed by family in wheelchair to to lobby alert and oriented x4. ?

## 2022-01-08 NOTE — ED Provider Notes (Signed)
? ?[facility] ?Provider Note ? ? ? Event Date/Time  ? First MD Initiated Contact with Patient 01/08/22 1229   ?  (approximate) ? ? ?History  ? ?Chief Complaint ?Abdominal Pain and Nausea ? ? ?HPI ? ?Jo Malone is a 86 y.o. female with past medical history of asthma, GERD, esophageal stricture, arthritis, and C. difficile who presents to the ED complaining of abdominal pain and nausea.  Patient reports that she deals with chronic pain across both sides of her upper abdomen for the past couple of years.  She has seen her PCP for this problem and reports unremarkable right upper quadrant ultrasound in the past, but states she has not seen a GI specialist in many years.  She has had acute worsening of her pain over the past 3 days associated with multiple episodes of diarrhea.  She has been feeling nauseous with dry heaves, but has not vomited.  She denies any fevers, dysuria, hematuria, or flank pain.  She has not been taking anything for her symptoms at home. ?  ? ? ?Physical Exam  ? ?Triage Vital Signs: ?ED Triage Vitals  ?Enc Vitals Group  ?   BP 01/08/22 1059 123/68  ?   Pulse Rate 01/08/22 1059 79  ?   Resp 01/08/22 1059 18  ?   Temp 01/08/22 1059 98.7 ?F (37.1 ?C)  ?   Temp Source 01/08/22 1059 Oral  ?   SpO2 01/08/22 1059 97 %  ?   Weight 01/08/22 1058 180 lb (81.6 kg)  ?   Height 01/08/22 1058 '5\' 3"'$  (1.6 m)  ?   Head Circumference --   ?   Peak Flow --   ?   Pain Score 01/08/22 1058 10  ?   Pain Loc --   ?   Pain Edu? --   ?   Excl. in [place]? --   ? ? ?Most recent vital signs: ?Vitals:  ? 01/08/22 1059 01/08/22 1205  ?BP: 123/68 (!) 151/65  ?Pulse: 79 80  ?Resp: 18 16  ?Temp: 98.7 ?F (37.1 ?C)   ?SpO2: 97% 96%  ? ? ?Constitutional: Alert and oriented. ?Eyes: Conjunctivae are normal. ?Head: Atraumatic. ?Nose: No congestion/rhinnorhea. ?Mouth/Throat: Mucous membranes are moist.  ?Cardiovascular: Normal rate, regular rhythm. Grossly normal heart sounds.  2+ radial pulses  bilaterally. ?Respiratory: Normal respiratory effort.  No retractions. Lungs CTAB. ?Gastrointestinal: Soft and diffusely tender to palpation with no rebound or guarding.  No CVA tenderness bilaterally.  No distention. ?Musculoskeletal: No lower extremity tenderness nor edema.  ?Neurologic:  Normal speech and language. No gross focal neurologic deficits are appreciated. ? ? ? ?ED Results / Procedures / Treatments  ? ?Labs ?(all labs ordered are listed, but only abnormal results are displayed) ?Labs Reviewed  ?COMPREHENSIVE METABOLIC PANEL - Abnormal; Notable for the following components:  ?    Result Value  ? Glucose, Bld 118 (*)   ? GFR, Estimated 59 (*)   ? All other components within normal limits  ?URINALYSIS, ROUTINE W REFLEX MICROSCOPIC - Abnormal; Notable for the following components:  ? Color, Urine YELLOW (*)   ? APPearance CLEAR (*)   ? All other components within normal limits  ?GASTROINTESTINAL PANEL BY PCR, STOOL (REPLACES STOOL CULTURE)  ?C DIFFICILE QUICK SCREEN W PCR REFLEX    ?LIPASE, BLOOD  ?CBC  ? ? ?RADIOLOGY ?CT abdomen/pelvis reviewed by me with no inflammatory changes, focal fluid collection, or dilated bowel loops. ? ?PROCEDURES: ? ?Critical Care performed:  No ? ?Procedures ? ? ?MEDICATIONS ORDERED IN ED: ?Medications  ?ondansetron (ZOFRAN-ODT) disintegrating tablet 4 mg (4 mg Oral Given 01/08/22 1215)  ?ondansetron (ZOFRAN) injection 4 mg (4 mg Intravenous Given 01/08/22 1447)  ?lactated ringers bolus 1,000 mL (1,000 mLs Intravenous New Bag/Given 01/08/22 1447)  ?iohexol (OMNIPAQUE) 300 MG/ML solution 100 mL (100 mLs Intravenous Contrast Given 01/08/22 1504)  ? ? ? ?IMPRESSION / MDM / ASSESSMENT AND PLAN / ED COURSE  ?I reviewed the triage vital signs and the nursing notes. ?             ?               ? ?86 y.o. female with past medical history of asthma, GERD, esophageal stricture, arthritis, and C. difficile who presents to the ED complaining of acute on chronic diffuse abdominal pain for the  past 3 days associated with diarrhea and nausea. ? ?Differential diagnosis includes, but is not limited to, gastroenteritis, gastritis, GERD, pancreatitis, hepatitis, cholecystitis, appendicitis, diverticulitis, bowel obstruction. ? ?Patient nontoxic appearing and in no acute distress, vital signs are unremarkable.  She is diffusely tender to palpation on exam although initial labs are reassuring.  CBC without anemia or leukocytosis, BMP without electrolyte abnormality or AKI, LFTs and lipase are within normal limits.  UA does not show any signs of infection and no hematuria to suggest kidney stone.  We will further assess with CT scan, treat symptomatically with IV fluids and Zofran, patient declines any pain medication. ? ?CT scan is unremarkable and patient reports feeling better following IV fluids and Zofran.  Patient is appropriate for outpatient follow-up to establish care with PCP and GI, will be prescribed omeprazole and Zofran.  Patient and daughter counseled to have her return to the ED for new worsening symptoms, patient agrees with plan. ? ?  ? ? ?FINAL CLINICAL IMPRESSION(S) / ED DIAGNOSES  ? ?Final diagnoses:  ?Chronic abdominal pain  ?Nausea  ?Gastroesophageal reflux disease, unspecified whether esophagitis present  ? ? ? ?Rx / DC Orders  ? ?ED Discharge Orders   ? ?      Ordered  ?  omeprazole (PRILOSEC OTC) 20 MG tablet  Daily       ? 01/08/22 1654  ?  ondansetron (ZOFRAN-ODT) 4 MG disintegrating tablet  Every 8 hours PRN       ? 01/08/22 1654  ? ?  ?  ? ?  ? ? ? ?Note:  This document was prepared using Dragon voice recognition software and may include unintentional dictation errors. ?  ?Blake Divine, MD ?01/08/22 1656 ? ?

## 2022-01-08 NOTE — ED Notes (Signed)
See triage note presents with some generalized abd pain and n/d   states sx's started couple of days ago  denies any fever or vomiting  positive diarrhea   ?

## 2022-01-08 NOTE — ED Triage Notes (Signed)
C/o abdominal pain with Diarrhea and nausea X3 days ?

## 2022-01-08 NOTE — ED Notes (Signed)
Pts vital signs check and are stable. Pt is c/o nausea. Order placed for ODT Zofran. Pt advised not to drink any more of the cup of water that she had as this might hinder test that the MD orders.  ?

## 2022-01-08 NOTE — ED Notes (Signed)
Pt daughter to nurses statiion asking when a provider will be in to see patient. Daughter stating that pt has not had anything to eat or drink all day and has been getting out of stretcher. Daughter stating "Should I just take her home?" Pt daughter back to room with pt. Daughter informed that a provider will be in as soon as they can be. ?

## 2022-01-09 ENCOUNTER — Encounter: Payer: Self-pay | Admitting: Intensive Care

## 2022-03-18 ENCOUNTER — Other Ambulatory Visit: Payer: Self-pay

## 2022-03-24 ENCOUNTER — Ambulatory Visit: Payer: Medicare Other | Admitting: Gastroenterology

## 2023-06-10 ENCOUNTER — Other Ambulatory Visit: Payer: Self-pay

## 2023-06-10 ENCOUNTER — Emergency Department: Payer: Medicare Other

## 2023-06-10 ENCOUNTER — Emergency Department
Admission: EM | Admit: 2023-06-10 | Discharge: 2023-06-11 | Disposition: A | Discharge status: Home / Self Care | Payer: Medicare Other | Attending: Emergency Medicine | Admitting: Emergency Medicine

## 2023-06-10 DIAGNOSIS — R110 Nausea: Secondary | ICD-10-CM

## 2023-06-10 DIAGNOSIS — N179 Acute kidney failure, unspecified: Secondary | ICD-10-CM

## 2023-06-10 DIAGNOSIS — R1010 Upper abdominal pain, unspecified: Secondary | ICD-10-CM

## 2023-06-10 DIAGNOSIS — S0091XA Abrasion of unspecified part of head, initial encounter: Secondary | ICD-10-CM | POA: Diagnosis not present

## 2023-06-10 DIAGNOSIS — R1084 Generalized abdominal pain: Secondary | ICD-10-CM | POA: Diagnosis not present

## 2023-06-10 DIAGNOSIS — S0990XA Unspecified injury of head, initial encounter: Secondary | ICD-10-CM | POA: Diagnosis present

## 2023-06-10 DIAGNOSIS — W06XXXA Fall from bed, initial encounter: Secondary | ICD-10-CM | POA: Insufficient documentation

## 2023-06-10 DIAGNOSIS — N390 Urinary tract infection, site not specified: Secondary | ICD-10-CM

## 2023-06-10 LAB — CBC
HCT: 39.3 % (ref 36.0–46.0)
Hemoglobin: 12.6 g/dL (ref 12.0–15.0)
MCH: 26.6 pg (ref 26.0–34.0)
MCHC: 32.1 g/dL (ref 30.0–36.0)
MCV: 82.9 fL (ref 80.0–100.0)
Platelets: 317 10*3/uL (ref 150–400)
RBC: 4.74 MIL/uL (ref 3.87–5.11)
RDW: 14 % (ref 11.5–15.5)
WBC: 10.1 10*3/uL (ref 4.0–10.5)
nRBC: 0 % (ref 0.0–0.2)

## 2023-06-10 LAB — COMPREHENSIVE METABOLIC PANEL
ALT: 28 U/L (ref 0–44)
AST: 55 U/L — ABNORMAL HIGH (ref 15–41)
Albumin: 4.1 g/dL (ref 3.5–5.0)
Alkaline Phosphatase: 61 U/L (ref 38–126)
Anion gap: 15 (ref 5–15)
BUN: 26 mg/dL — ABNORMAL HIGH (ref 8–23)
CO2: 21 mmol/L — ABNORMAL LOW (ref 22–32)
Calcium: 9.8 mg/dL (ref 8.9–10.3)
Chloride: 100 mmol/L (ref 98–111)
Creatinine, Ser: 1.49 mg/dL — ABNORMAL HIGH (ref 0.44–1.00)
GFR, Estimated: 34 mL/min — ABNORMAL LOW (ref >60)
Glucose, Bld: 128 mg/dL — ABNORMAL HIGH (ref 70–99)
Potassium: 4 mmol/L (ref 3.5–5.1)
Sodium: 136 mmol/L (ref 135–145)
Total Bilirubin: 0.9 mg/dL (ref 0.3–1.2)
Total Protein: 8 g/dL (ref 6.5–8.1)

## 2023-06-10 LAB — URINALYSIS, ROUTINE W REFLEX MICROSCOPIC
Bilirubin Urine: NEGATIVE
Glucose, UA: NEGATIVE mg/dL
Hgb urine dipstick: NEGATIVE
Ketones, ur: NEGATIVE mg/dL
Nitrite: NEGATIVE
Protein, ur: 30 mg/dL — AB
Specific Gravity, Urine: 1.025 (ref 1.005–1.030)
pH: 5 (ref 5.0–8.0)

## 2023-06-10 LAB — LIPASE, BLOOD: Lipase: 23 U/L (ref 11–51)

## 2023-06-10 MED ORDER — KETOROLAC TROMETHAMINE 15 MG/ML IJ SOLN
15.0000 mg | Freq: Once | INTRAMUSCULAR | Status: AC
  Administered 2023-06-10: 15 mg via INTRAVENOUS
  Filled 2023-06-10: qty 1

## 2023-06-10 MED ORDER — ONDANSETRON HCL 4 MG/2ML IJ SOLN
4.0000 mg | Freq: Once | INTRAMUSCULAR | Status: AC
  Administered 2023-06-10: 4 mg via INTRAVENOUS
  Filled 2023-06-10: qty 2

## 2023-06-10 MED ORDER — SODIUM CHLORIDE 0.9 % IV SOLN
2.0000 g | Freq: Once | INTRAVENOUS | Status: AC
  Administered 2023-06-11: 2 g via INTRAVENOUS
  Filled 2023-06-10: qty 20

## 2023-06-10 MED ORDER — SODIUM CHLORIDE 0.9 % IV BOLUS
1000.0000 mL | Freq: Once | INTRAVENOUS | Status: AC
  Administered 2023-06-10: 1000 mL via INTRAVENOUS

## 2023-06-10 MED ORDER — MORPHINE SULFATE (PF) 2 MG/ML IV SOLN
2.0000 mg | Freq: Once | INTRAVENOUS | Status: AC
  Administered 2023-06-10: 2 mg via INTRAVENOUS
  Filled 2023-06-10: qty 1

## 2023-06-10 MED ORDER — IOHEXOL 300 MG/ML  SOLN
75.0000 mL | Freq: Once | INTRAMUSCULAR | Status: AC | PRN
  Administered 2023-06-10: 75 mL via INTRAVENOUS

## 2023-06-10 NOTE — ED Provider Notes (Signed)
11:55 PM  Assumed care at shift change.  Patient here with complaints of nausea.  Has a UTI.  CT abdomen pelvis pending.  Patient also had a fall with head injury a couple days ago.  CT head also pending.  12:39 AM  Pt's CT scans reviewed and interpreted by myself and the radiologist and showed no acute abnormality.  Discussed incidental findings with family.  She does have a mild AKI and has received a liter of fluid here.  Recommended encouraging fluids at home.  Will discharge with Keflex for her UTI.  Culture is pending.  Patient tolerating p.o. here.  Will discharge.  At this time, I do not feel there is any life-threatening condition present. I reviewed all nursing notes, vitals, pertinent previous records.  All lab and urine results, EKGs, imaging ordered have been independently reviewed and interpreted by myself.  I reviewed all available radiology reports from any imaging ordered this visit.  Based on my assessment, I feel the patient is safe to be discharged home without further emergent workup and can continue workup as an outpatient as needed. Discussed all findings, treatment plan as well as usual and customary return precautions.  They verbalize understanding and are comfortable with this plan.  Outpatient follow-up has been provided as needed.  All questions have been answered.    Aleanna Menge, Layla Maw, DO 06/11/23 [phone]

## 2023-06-10 NOTE — ED Triage Notes (Signed)
Pt c/o of bloating, nauseous and dry heaving, and decreased appetite x1 week. Pt denies CP, SHOB, dizziness. Pt is AOX4, NAD noted. Abdomen is round, soft, distended.

## 2023-06-10 NOTE — ED Provider Notes (Signed)
[facility] Provider Note    Event Date/Time   First MD Initiated Contact with Patient 06/10/23 2208     (approximate)   History   Nausea   HPI  Jo Malone is a 87 year old female with history of chronic constipation, C. difficile colitis presenting to the emergency department for evaluation of abdominal pain and nausea.  Patient reports that for the last week she has felt bloated with nausea and dry heaving without vomiting.  Poor p.o. intake during this time.  No chest pain or shortness of breath.  No diarrhea, having regular bowel movements.  No fevers or chills.  Does report that yesterday after getting out of bed she fell and hit her head.  No loss of consciousness.  Denies significant headache.  No neck pain.     Physical Exam   Triage Vital Signs: ED Triage Vitals [06/10/23 2104]  Encounter Vitals Group     BP (!) 155/85     Systolic BP Percentile      Diastolic BP Percentile      Pulse Rate (!) 104     Resp 18     Temp 98.3 F (36.8 C)     Temp Source Oral     SpO2 95 %     Weight      Height      Head Circumference      Peak Flow      Pain Score 0     Pain Loc      Pain Education      Exclude from Growth Chart     Most recent vital signs: Vitals:   06/10/23 2104  BP: (!) 155/85  Pulse: (!) 104  Resp: 18  Temp: 98.3 F (36.8 C)  SpO2: 95%     General: Awake, interactive  Head:  Abrasion over the right side of the head without open areas of skin or significant hematoma. CV:  Regular rate, good peripheral perfusion.  Resp:  Lungs clear, unlabored respirations.  Abd:  Soft, nondistended, mild generalized tenderness to palpation most notably in the upper abdomen Neuro:  Symmetric facial movement, fluid speech   ED Results / Procedures / Treatments   Labs (all labs ordered are listed, but only abnormal results are displayed) Labs Reviewed  COMPREHENSIVE METABOLIC PANEL - Abnormal; Notable for the following  components:      Result Value   CO2 21 (*)    Glucose, Bld 128 (*)    BUN 26 (*)    Creatinine, Ser 1.49 (*)    AST 55 (*)    GFR, Estimated 34 (*)    All other components within normal limits  URINALYSIS, ROUTINE W REFLEX MICROSCOPIC - Abnormal; Notable for the following components:   Color, Urine AMBER (*)    APPearance CLOUDY (*)    Protein, ur 30 (*)    Leukocytes,Ua MODERATE (*)    Bacteria, UA MANY (*)    All other components within normal limits  URINE CULTURE  LIPASE, BLOOD  CBC     EKG EKG independently reviewed interpreted by myself (ER attending) demonstrates:    RADIOLOGY Imaging independently reviewed and interpreted by myself demonstrates:  CT head pending CT abdomen pelvis pending  PROCEDURES:  Critical Care performed: No  Procedures   MEDICATIONS ORDERED IN ED: Medications  ondansetron (ZOFRAN) injection 4 mg (4 mg Intravenous Given 06/10/23 2227)  sodium chloride 0.9 % bolus 1,000 mL (1,000 mLs Intravenous New Bag/Given 06/10/23 2229)  morphine (PF) 2 MG/ML injection 2 mg (2 mg Intravenous Given 06/10/23 2231)  iohexol (OMNIPAQUE) 300 MG/ML solution 75 mL (75 mLs Intravenous Contrast Given 06/10/23 2245)  ketorolac (TORADOL) 15 MG/ML injection 15 mg (15 mg Intravenous Given 06/10/23 2321)     IMPRESSION / MDM / ASSESSMENT AND PLAN / ED COURSE  I reviewed the triage vital signs and the nursing notes.  Differential diagnosis includes, but is not limited to, Bowel obstruction, diverticulitis, colitis, other acute intra-abdominal process  Patient's presentation is most consistent with acute presentation with potential threat to life or bodily function.  87 year old female presenting to the emergency department for evaluation of abdominal pain and nausea with dry heaving.  Tachycardic on presentation here.  Labs with mild AKI with creatinine of 1.49.  Normal white blood cell count and lipase.  Urine is concerning for infection with moderate leukocyte  esterase, many bacteria, 21-50 white blood cells.  Will send urine culture.  With recent fall with head trauma will obtain CT head as well as CT abdomen pelvis.  Signed out to OB/GYN provider pending CT imaging, reevaluation, and disposition.  Scans are reassuring and patient is able to tolerate p.o., can potentially be discharged home with antibiotics for her UTI.      FINAL CLINICAL IMPRESSION(S) / ED DIAGNOSES   Final diagnoses:  Nausea  Pain of upper abdomen     Rx / DC Orders   ED Discharge Orders     None        Note:  This document was prepared using Dragon voice recognition software and may include unintentional dictation errors.   Trinna Post, MD 06/10/23 [phone]

## 2023-06-11 MED ORDER — CEPHALEXIN 500 MG PO CAPS: 500.0000 mg | ORAL_CAPSULE | Freq: Two times a day (BID) | ORAL | 0 refills | Status: AC

## 2023-06-11 MED ORDER — ONDANSETRON 4 MG PO TBDP: 4.0000 mg | ORAL_TABLET | Freq: Four times a day (QID) | ORAL | 0 refills | Status: AC | PRN

## 2023-06-11 NOTE — Discharge Instructions (Addendum)
CT abdomen:  IMPRESSION:  1. No acute intra-abdominal pathology identified. No definite  radiographic explanation for the patient's reported symptoms.  2. Severe sigmoid diverticulosis without superimposed acute  inflammatory change.  3. Mild hepatic steatosis.  4. Stable 9 mm cystic lesion within the head of the pancreas,  possibly representing a pancreatic cyst or side branch ectasia. If  indicated, this could be reassessed with dedicated MRI examination  in 2 years to document stability.    CT head:  IMPRESSION: 1. No acute intracranial abnormality. No calvarial fracture. 2. Postsurgical changes within the paranasal sinuses. Mixed lytic and sclerotic lesion within the clivus which may communicate with the right sphenoid sinus but is not well assessed on this examination and is indeterminate. Comparison with prior examinations would be helpful in determining chronicity. If none are available, nonemergent MRI examination with and without contrast is recommended for further evaluation.

## 2023-06-13 LAB — URINE CULTURE: Culture: >=100000 — AB

## 2024-06-06 ENCOUNTER — Emergency Department
Admission: EM | Admit: 2024-06-06 | Discharge: 2024-06-06 | Disposition: A | Discharge status: Home / Self Care | Attending: Emergency Medicine | Admitting: Emergency Medicine

## 2024-06-06 ENCOUNTER — Emergency Department

## 2024-06-06 ENCOUNTER — Other Ambulatory Visit: Payer: Self-pay

## 2024-06-06 DIAGNOSIS — D72829 Elevated white blood cell count, unspecified: Secondary | ICD-10-CM | POA: Insufficient documentation

## 2024-06-06 DIAGNOSIS — J45909 Unspecified asthma, uncomplicated: Secondary | ICD-10-CM | POA: Diagnosis not present

## 2024-06-06 DIAGNOSIS — R1031 Right lower quadrant pain: Secondary | ICD-10-CM | POA: Diagnosis present

## 2024-06-06 DIAGNOSIS — R1030 Lower abdominal pain, unspecified: Secondary | ICD-10-CM

## 2024-06-06 DIAGNOSIS — N3000 Acute cystitis without hematuria: Secondary | ICD-10-CM | POA: Insufficient documentation

## 2024-06-06 LAB — COMPREHENSIVE METABOLIC PANEL WITH GFR
ALT: 24 U/L (ref 0–44)
AST: 34 U/L (ref 15–41)
Albumin: 4 g/dL (ref 3.5–5.0)
Alkaline Phosphatase: 122 U/L (ref 38–126)
Anion gap: 15 (ref 5–15)
BUN: 20 mg/dL (ref 8–23)
CO2: 21 mmol/L — ABNORMAL LOW (ref 22–32)
Calcium: 9.8 mg/dL (ref 8.9–10.3)
Chloride: 101 mmol/L (ref 98–111)
Creatinine, Ser: 1.41 mg/dL — ABNORMAL HIGH (ref 0.44–1.00)
GFR, Estimated: 37 mL/min — ABNORMAL LOW (ref >60)
Glucose, Bld: 120 mg/dL — ABNORMAL HIGH (ref 70–99)
Potassium: 3.9 mmol/L (ref 3.5–5.1)
Sodium: 137 mmol/L (ref 135–145)
Total Bilirubin: 0.7 mg/dL (ref 0.0–1.2)
Total Protein: 7.6 g/dL (ref 6.5–8.1)

## 2024-06-06 LAB — URINALYSIS, ROUTINE W REFLEX MICROSCOPIC
Bilirubin Urine: NEGATIVE
Glucose, UA: NEGATIVE mg/dL
Hgb urine dipstick: NEGATIVE
Ketones, ur: NEGATIVE mg/dL
Leukocytes,Ua: NEGATIVE
Nitrite: NEGATIVE
Protein, ur: NEGATIVE mg/dL
Specific Gravity, Urine: >1.046 — ABNORMAL HIGH (ref 1.005–1.030)
pH: 5 (ref 5.0–8.0)

## 2024-06-06 LAB — CBC WITH DIFFERENTIAL/PLATELET
Abs Immature Granulocytes: 0.1 K/uL — ABNORMAL HIGH (ref 0.00–0.07)
Basophils Absolute: 0 K/uL (ref 0.0–0.1)
Basophils Relative: 0 %
Eosinophils Absolute: 0.4 K/uL (ref 0.0–0.5)
Eosinophils Relative: 4 %
HCT: 43 % (ref 36.0–46.0)
Hemoglobin: 13.8 g/dL (ref 12.0–15.0)
Immature Granulocytes: 1 %
Lymphocytes Relative: 21 %
Lymphs Abs: 2.3 K/uL (ref 0.7–4.0)
MCH: 27 pg (ref 26.0–34.0)
MCHC: 32.1 g/dL (ref 30.0–36.0)
MCV: 84 fL (ref 80.0–100.0)
Monocytes Absolute: 1.4 K/uL — ABNORMAL HIGH (ref 0.1–1.0)
Monocytes Relative: 13 %
Neutro Abs: 6.8 K/uL (ref 1.7–7.7)
Neutrophils Relative %: 61 %
Platelets: 266 K/uL (ref 150–400)
RBC: 5.12 MIL/uL — ABNORMAL HIGH (ref 3.87–5.11)
RDW: 14 % (ref 11.5–15.5)
WBC: 11 K/uL — ABNORMAL HIGH (ref 4.0–10.5)
nRBC: 0 % (ref 0.0–0.2)

## 2024-06-06 LAB — LIPASE, BLOOD: Lipase: 23 U/L (ref 11–51)

## 2024-06-06 MED ORDER — LACTATED RINGERS IV BOLUS
1000.0000 mL | Freq: Once | INTRAVENOUS | Status: AC
  Administered 2024-06-06: 1000 mL via INTRAVENOUS

## 2024-06-06 MED ORDER — CEFUROXIME AXETIL 500 MG PO TABS
500.0000 mg | ORAL_TABLET | Freq: Once | ORAL | Status: AC
  Administered 2024-06-06: 500 mg via ORAL
  Filled 2024-06-06: qty 1

## 2024-06-06 MED ORDER — ONDANSETRON HCL 4 MG/2ML IJ SOLN
4.0000 mg | Freq: Once | INTRAMUSCULAR | Status: AC
  Administered 2024-06-06: 4 mg via INTRAVENOUS
  Filled 2024-06-06: qty 2

## 2024-06-06 MED ORDER — MORPHINE SULFATE (PF) 4 MG/ML IV SOLN
4.0000 mg | Freq: Once | INTRAVENOUS | Status: AC
  Administered 2024-06-06: 4 mg via INTRAVENOUS
  Filled 2024-06-06: qty 1

## 2024-06-06 MED ORDER — CEFUROXIME AXETIL 250 MG PO TABS: 250.0000 mg | ORAL_TABLET | Freq: Two times a day (BID) | ORAL | 0 refills | Status: AC

## 2024-06-06 MED ORDER — IOHEXOL 300 MG/ML  SOLN
80.0000 mL | Freq: Once | INTRAMUSCULAR | Status: AC | PRN
  Administered 2024-06-06: 80 mL via INTRAVENOUS

## 2024-06-06 NOTE — ED Provider Notes (Signed)
 [facility] Provider Note    Event Date/Time   First MD Initiated Contact with Patient 06/06/24 1913     (approximate)   History   Chief Complaint Abdominal Pain   HPI  Jo Malone is a 88 y.o. female with past medical history of GERD, hiatal hernia, asthma, and appendectomy who presents to the ED complaining of abdominal pain.  Patient reports that she had an episode of diarrhea about 1 week ago, subsequently developed some lower abdominal discomfort that increased throughout the week.  She was seen at urgent care 3 days ago and diagnosed with a UTI, subsequently prescribed Macrobid.  She states she has been taking this consistently, but pain has been increasing.  She describes sharp pain across both sides of her lower abdomen, denies any dysuria or other urinary symptoms.  She has not had any fevers, flank pain, nausea, vomiting, or additional episodes of diarrhea.     Physical Exam   Triage Vital Signs: ED Triage Vitals [06/06/24 1824]  Encounter Vitals Group     BP (!) 134/91     Girls Systolic BP Percentile      Girls Diastolic BP Percentile      Boys Systolic BP Percentile      Boys Diastolic BP Percentile      Pulse Rate (!) 115     Resp 18     Temp 98.7 F (37.1 C)     Temp Source Oral     SpO2 98 %     Weight 183 lb (83 kg)     Height 5' 3 (1.6 m)     Head Circumference      Peak Flow      Pain Score 10     Pain Loc      Pain Education      Exclude from Growth Chart     Most recent vital signs: Vitals:   06/06/24 2036 06/06/24 2122  BP:  (!) 147/79  Pulse:  75  Resp:  17  Temp:  98.1 F (36.7 C)  SpO2: 98% 98%    Constitutional: Alert and oriented. Eyes: Conjunctivae are normal. Head: Atraumatic. Nose: No congestion/rhinnorhea. Mouth/Throat: Mucous membranes are moist.  Cardiovascular: Tachycardic, regular rhythm. Grossly normal heart sounds.  2+ radial pulses bilaterally. Respiratory: Normal respiratory effort.   No retractions. Lungs CTAB. Gastrointestinal: Soft and tender to palpation in the bilateral lower quadrants, left greater than right. No distention. Musculoskeletal: No lower extremity tenderness nor edema.  Neurologic:  Normal speech and language. No gross focal neurologic deficits are appreciated.    ED Results / Procedures / Treatments   Labs (all labs ordered are listed, but only abnormal results are displayed) Labs Reviewed  URINALYSIS, ROUTINE W REFLEX MICROSCOPIC - Abnormal; Notable for the following components:      Result Value   Color, Urine YELLOW (*)    APPearance HAZY (*)    Specific Gravity, Urine >1.046 (*)    All other components within normal limits  COMPREHENSIVE METABOLIC PANEL WITH GFR - Abnormal; Notable for the following components:   CO2 21 (*)    Glucose, Bld 120 (*)    Creatinine, Ser 1.41 (*)    GFR, Estimated 37 (*)    All other components within normal limits  CBC WITH DIFFERENTIAL/PLATELET - Abnormal; Notable for the following components:   WBC 11.0 (*)    RBC 5.12 (*)    Monocytes Absolute 1.4 (*)    Abs Immature Granulocytes  0.10 (*)    All other components within normal limits  URINE CULTURE  LIPASE, BLOOD    RADIOLOGY CT abdomen/pelvis reviewed and interpreted by me with no inflammatory changes, focal fluid collections, or dilated bowel loops.  PROCEDURES:  Critical Care performed: No  Procedures   MEDICATIONS ORDERED IN ED: Medications  cefUROXime (CEFTIN) tablet 500 mg (has no administration in time range)  morphine  (PF) 4 MG/ML injection 4 mg (4 mg Intravenous Given 06/06/24 1939)  ondansetron  (ZOFRAN ) injection 4 mg (4 mg Intravenous Given 06/06/24 1939)  lactated ringers  bolus 1,000 mL (0 mLs Intravenous Stopped 06/06/24 2120)  iohexol  (OMNIPAQUE ) 300 MG/ML solution 80 mL (80 mLs Intravenous Contrast Given 06/06/24 1952)     IMPRESSION / MDM / ASSESSMENT AND PLAN / ED COURSE  I reviewed the triage vital signs and the nursing  notes.                              88 y.o. female with past medical history of GERD, hiatal hernia, asthma, and appendectomy who presents to the ED complaining of 1 week of increasing lower abdominal pain.  Patient's presentation is most consistent with acute presentation with potential threat to life or bodily function.  Differential diagnosis includes, but is not limited to, colitis, diverticulitis, UTI, kidney stone, bowel obstruction, pancreatitis, hepatitis, cholecystitis, biliary colic, gastritis, GERD.  Patient nontoxic-appearing and in no acute distress, vital signs remarkable for tachycardia but otherwise reassuring.  Her abdomen is soft but she has tenderness to palpation in the bilateral lower quadrants, left greater than right.  Labs with mild leukocytosis but no significant anemia, electrolyte abnormality, or AKI.  LFTs are unremarkable, urinalysis and lipase are pending.  We will further assess with CT imaging, treat symptomatically with IV morphine  and Zofran .  CT imaging of the abdomen/pelvis is unremarkable, urinalysis with no clear evidence of UTI.  She did reportedly have a urinalysis consistent with UTI 3 days ago, has previously grown bacteria resistant to Macrobid, and I would consider ongoing UTI given no alternative explanation.  We will switch antibiotic to Ceftin, but she is appropriate for discharge home with outpatient follow-up.  She was counseled to return to the ED for new or worsening symptoms, patient agrees with plan.      FINAL CLINICAL IMPRESSION(S) / ED DIAGNOSES   Final diagnoses:  Lower abdominal pain  Acute cystitis without hematuria     Rx / DC Orders   ED Discharge Orders          Ordered    cefUROXime (CEFTIN) 250 MG tablet  2 times daily with meals        06/06/24 2320             Note:  This document was prepared using Dragon voice recognition software and may include unintentional dictation errors.   Willo Dunnings,  MD 06/06/24 2322

## 2024-06-06 NOTE — ED Triage Notes (Addendum)
 Jo Malone

## 2024-06-06 NOTE — ED Triage Notes (Signed)
 Patient to ED via [facility] for abdominal pain that radiates into lower back; currently being treated with Macrobid for UTI.    Temp 98.3 BP 123/95

## 2024-06-07 ENCOUNTER — Other Ambulatory Visit: Payer: Self-pay

## 2024-06-07 ENCOUNTER — Emergency Department
Admission: EM | Admit: 2024-06-07 | Discharge: 2024-06-08 | Disposition: A | Discharge status: Home / Self Care | Attending: Emergency Medicine | Admitting: Emergency Medicine

## 2024-06-07 ENCOUNTER — Emergency Department

## 2024-06-07 DIAGNOSIS — R197 Diarrhea, unspecified: Secondary | ICD-10-CM | POA: Insufficient documentation

## 2024-06-07 DIAGNOSIS — R531 Weakness: Secondary | ICD-10-CM | POA: Insufficient documentation

## 2024-06-07 DIAGNOSIS — R1032 Left lower quadrant pain: Secondary | ICD-10-CM | POA: Diagnosis not present

## 2024-06-07 DIAGNOSIS — R1031 Right lower quadrant pain: Secondary | ICD-10-CM | POA: Insufficient documentation

## 2024-06-07 DIAGNOSIS — J45909 Unspecified asthma, uncomplicated: Secondary | ICD-10-CM | POA: Diagnosis not present

## 2024-06-07 DIAGNOSIS — D72829 Elevated white blood cell count, unspecified: Secondary | ICD-10-CM | POA: Insufficient documentation

## 2024-06-07 LAB — CBC WITH DIFFERENTIAL/PLATELET
Abs Immature Granulocytes: 0.03 K/uL (ref 0.00–0.07)
Basophils Absolute: 0 K/uL (ref 0.0–0.1)
Basophils Relative: 0 %
Eosinophils Absolute: 0.3 K/uL (ref 0.0–0.5)
Eosinophils Relative: 4 %
HCT: 38.4 % (ref 36.0–46.0)
Hemoglobin: 12.5 g/dL (ref 12.0–15.0)
Immature Granulocytes: 0 %
Lymphocytes Relative: 19 %
Lymphs Abs: 1.6 K/uL (ref 0.7–4.0)
MCH: 27 pg (ref 26.0–34.0)
MCHC: 32.6 g/dL (ref 30.0–36.0)
MCV: 82.9 fL (ref 80.0–100.0)
Monocytes Absolute: 1.1 K/uL — ABNORMAL HIGH (ref 0.1–1.0)
Monocytes Relative: 13 %
Neutro Abs: 5.3 K/uL (ref 1.7–7.7)
Neutrophils Relative %: 64 %
Platelets: 249 K/uL (ref 150–400)
RBC: 4.63 MIL/uL (ref 3.87–5.11)
RDW: 14.2 % (ref 11.5–15.5)
Smear Review: NORMAL
WBC: 8.3 K/uL (ref 4.0–10.5)
nRBC: 0 % (ref 0.0–0.2)

## 2024-06-07 LAB — URINALYSIS, W/ REFLEX TO CULTURE (INFECTION SUSPECTED)
Bilirubin Urine: NEGATIVE
Glucose, UA: NEGATIVE mg/dL
Hgb urine dipstick: NEGATIVE
Ketones, ur: NEGATIVE mg/dL
Leukocytes,Ua: NEGATIVE
Nitrite: NEGATIVE
Protein, ur: 30 mg/dL — AB
Specific Gravity, Urine: >1.046 — ABNORMAL HIGH (ref 1.005–1.030)
pH: 5 (ref 5.0–8.0)

## 2024-06-07 LAB — COMPREHENSIVE METABOLIC PANEL WITH GFR
ALT: 20 U/L (ref 0–44)
AST: 31 U/L (ref 15–41)
Albumin: 3.6 g/dL (ref 3.5–5.0)
Alkaline Phosphatase: 114 U/L (ref 38–126)
Anion gap: 14 (ref 5–15)
BUN: 25 mg/dL — ABNORMAL HIGH (ref 8–23)
CO2: 22 mmol/L (ref 22–32)
Calcium: 9.4 mg/dL (ref 8.9–10.3)
Chloride: 101 mmol/L (ref 98–111)
Creatinine, Ser: 1.66 mg/dL — ABNORMAL HIGH (ref 0.44–1.00)
GFR, Estimated: 30 mL/min — ABNORMAL LOW (ref >60)
Glucose, Bld: 114 mg/dL — ABNORMAL HIGH (ref 70–99)
Potassium: 3.7 mmol/L (ref 3.5–5.1)
Sodium: 137 mmol/L (ref 135–145)
Total Bilirubin: 0.6 mg/dL (ref 0.0–1.2)
Total Protein: 6.9 g/dL (ref 6.5–8.1)

## 2024-06-07 LAB — LIPASE, BLOOD: Lipase: 13 U/L (ref 11–51)

## 2024-06-07 LAB — TROPONIN I (HIGH SENSITIVITY)
Troponin I (High Sensitivity): 17 ng/L (ref <18)
Troponin I (High Sensitivity): 14 ng/L (ref <18)

## 2024-06-07 MED ORDER — LACTATED RINGERS IV BOLUS
1000.0000 mL | Freq: Once | INTRAVENOUS | Status: AC
  Administered 2024-06-07: 1000 mL via INTRAVENOUS

## 2024-06-07 NOTE — ED Provider Notes (Signed)
-----------------------------------------   10:58 PM on 06/07/2024 -----------------------------------------  Assuming care from Dr. Jossie.  In short, Jo Malone is a 88 y.o. female with a chief complaint of diarrhea.  Refer to the original H&P for additional details.  The current plan of care is to reassess after CT scan and fluids.   Clinical Course as of 06/08/24 0040  Fri Jun 08, 2024  0037 I independently viewed and interpreted the patient's abd/pelvis CT, as well as reviewing the radiologist's report.  No acute changes from yesterday, no evidence of diverticulitis or obvious colitis/enteritis.  Confirmed by radiology. [CF]  0038 Reassessed patient.  She is comfortable, and has had no bowel movements in the last 4-1/2 hours.  She now has her granddaughter and her granddaughters boyfriend with her.  The granddaughter clarified that she does not live alone, and in fact lives in a separate living space at the house of the patient's daughter and granddaughter.  The patient is comfortable going home as there is no evidence of an acute or emergent condition.  She may benefit from referral to outpatient providers in the local area because her primary care doctor is based in   where the patient has her main home.  I gave my usual customary follow-up recommendations and return precautions and encouraged the patient and her granddaughter to pick up the patient's prescription for antibiotics that was prescribed during her visit last night. [CF]    Clinical Course User Index [CF] Gordan Huxley, MD     Medications  lactated ringers  bolus 1,000 mL (0 mLs Intravenous Stopped 06/07/24 2159)     ED Discharge Orders     None      Final diagnoses:  Diarrhea, unspecified type     Gordan Huxley, MD 06/08/24 0040

## 2024-06-07 NOTE — ED Provider Notes (Signed)
 [facility] Provider Note   Event Date/Time   First MD Initiated Contact with Patient 06/07/24 2011     (approximate) History  Weakness, Diarrhea, and Abdominal Pain  HPI Jo Malone is a 88 y.o. female with a stated past medical history of asthma, GERD, hiatal hernia, and neuropathy who presents complaining of weakness and ongoing diarrhea with bilateral lower quadrant abdominal pain since 9/27.  Patient was just seen last night on 06/06/2024 and received full workup including a CT of the abdomen that showed diverticulosis without diverticulitis as well as an hiatal hernia without any acute findings.  Patient did have mild leukocytosis at 11.  Patient states that she has continued diarrhea as well as bilateral lower abdominal quadrant pain that radiates through to her back. ROS: Patient currently denies any vision changes, tinnitus, difficulty speaking, facial droop, sore throat, chest pain, shortness of breath, nausea/vomiting, dysuria, or weakness/numbness/paresthesias in any extremity   Physical Exam  Triage Vital Signs: ED Triage Vitals  Encounter Vitals Group     BP 06/07/24 2008 (!) 149/102     Girls Systolic BP Percentile --      Girls Diastolic BP Percentile --      Boys Systolic BP Percentile --      Boys Diastolic BP Percentile --      Pulse Rate 06/07/24 2008 82     Resp 06/07/24 2008 16     Temp 06/07/24 2008 98.5 F (36.9 C)     Temp Source 06/07/24 2008 Oral     SpO2 06/07/24 2008 96 %     Weight 06/07/24 2010 197 lb (89.4 kg)     Height 06/07/24 2010 5' 3 (1.6 m)     Head Circumference --      Peak Flow --      Pain Score 06/07/24 2009 9     Pain Loc --      Pain Education --      Exclude from Growth Chart --    Most recent vital signs: Vitals:   06/07/24 2008  BP: (!) 149/102  Pulse: 82  Resp: 16  Temp: 98.5 F (36.9 C)  SpO2: 96%   General: Awake, oriented x4. CV:  Good peripheral perfusion. Resp:  Normal  effort. Abd:  No distention.  Tenderness to palpation of bilateral lower quadrants Other:  Patient is an obese elderly Caucasian female resting comfortably in no acute distress ED Results / Procedures / Treatments  Labs (all labs ordered are listed, but only abnormal results are displayed) Labs Reviewed  C DIFFICILE QUICK SCREEN W PCR REFLEX    COMPREHENSIVE METABOLIC PANEL WITH GFR  LIPASE, BLOOD  CBC WITH DIFFERENTIAL/PLATELET  URINALYSIS, W/ REFLEX TO CULTURE (INFECTION SUSPECTED)  TROPONIN I (HIGH SENSITIVITY)   EKG ED ECG REPORT I, Artist MARLA Kerns, the attending physician, personally viewed and interpreted this ECG. Date: 06/07/2024 EKG Time: 2008 Rate: 83 Rhythm: normal sinus rhythm QRS Axis: normal Intervals: normal ST/T Wave abnormalities: normal Narrative Interpretation: no evidence of acute ischemia RADIOLOGY ED MD interpretation: Pending - All radiology independently interpreted and agree with radiology assessment Official radiology report(s): No results found. PROCEDURES: Critical Care performed: No Procedures MEDICATIONS ORDERED IN ED: Medications  lactated ringers  bolus 1,000 mL (has no administration in time range)   IMPRESSION / MDM / ASSESSMENT AND PLAN / ED COURSE  I reviewed the triage vital signs and the nursing notes.  The patient is on the cardiac monitor to evaluate for evidence of arrhythmia and/or significant heart rate changes. Patient's presentation is most consistent with acute presentation with potential threat to life or bodily function. Patient is an 88 year old female with the above-stated past medical history was seen for the second time in 24 hours complaining of bilateral lower quadrant abdominal pain with associated nonbloody diarrhea.  Patient had extensive workup yesterday including leukocytosis to 11 and CT scan that was negative for any acute findings. DDx: Antibiotic associated diarrhea, infectious  diarrhea, C. difficile, pancreatic insufficiency Plan: CBC, CMP, lipase, UA, troponin, C. difficile PCR  Pending CT of the abdomen and pelvis.  Care of this patient will be signed out to the oncoming physician at the end of my shift.  All pertinent patient information conveyed and all questions answered.  All further care and disposition decisions will be made by the oncoming physician. Clinical Course as of 06/10/24 9271  Fri Jun 08, 2024  0037 I independently viewed and interpreted the patient's abd/pelvis CT, as well as reviewing the radiologist's report.  No acute changes from yesterday, no evidence of diverticulitis or obvious colitis/enteritis.  Confirmed by radiology. [CF]  0038 Reassessed patient.  She is comfortable, and has had no bowel movements in the last 4-1/2 hours.  She now has her granddaughter and her granddaughters boyfriend with her.  The granddaughter clarified that she does not live alone, and in fact lives in a separate living space at the house of the patient's daughter and granddaughter.  The patient is comfortable going home as there is no evidence of an acute or emergent condition.  She may benefit from referral to outpatient providers in the local area because her primary care doctor is based in [place]  where the patient has her main home.  I gave my usual customary follow-up recommendations and return precautions and encouraged the patient and her granddaughter to pick up the patient's prescription for antibiotics that was prescribed during her visit last night. [CF]    Clinical Course User Index [CF] Gordan Huxley, MD   FINAL CLINICAL IMPRESSION(S) / ED DIAGNOSES   Final diagnoses:  None   Rx / DC Orders   ED Discharge Orders     None      Note:  This document was prepared using Dragon voice recognition software and may include unintentional dictation errors.   Jo Wanat K, MD 06/10/24 [phone]

## 2024-06-07 NOTE — ED Triage Notes (Signed)
 Patient presents to the ED via [facility] for c/o weakness and ongoing diarrhea since 9/27. Denies chest pain and SOB. AOX4, VSS.

## 2024-06-07 NOTE — ED Notes (Signed)
 Unable to collect stool sample as pt has had no bowel movements since arriving and at this time, does not feel the need to go

## 2024-06-08 LAB — URINE CULTURE

## 2024-06-08 NOTE — Discharge Instructions (Signed)
 Your workup in the Emergency Department today was reassuring.  We did not find any specific abnormalities.  We recommend you drink plenty of fluids, take your regular medications and/or any new ones prescribed today (and remember to pick up your antibiotic from the pharmacy that was prescribed last night), and follow up with the doctor(s) listed in these documents as recommended.  If you would like to establish a local primary care provider, please refer either to the phone number for [place] [facility] or to the attached resource guide that list multiple local primary care offices that are accepting new patients.  You can call any of them including [facility] and explained that you are living at least part of the time locally and would like to set up an appointment with a provider  Return to the Emergency Department if you develop new or worsening symptoms that concern you.
